# Patient Record
Sex: Female | Born: 1990 | Race: White | Hispanic: No | Marital: Single | Smoking: Never smoker
Health system: Southern US, Community
[De-identification: ages and names within clinical notes are randomized; demographics above are authoritative.]

## PROBLEM LIST (undated history)

## (undated) DIAGNOSIS — D649 Anemia, unspecified: Secondary | ICD-10-CM

## (undated) DIAGNOSIS — F32A Depression, unspecified: Secondary | ICD-10-CM

## (undated) DIAGNOSIS — F329 Major depressive disorder, single episode, unspecified: Secondary | ICD-10-CM

## (undated) HISTORY — DX: Anemia, unspecified: D64.9

## (undated) HISTORY — PX: NO PAST SURGERIES: SHX2092

## (undated) HISTORY — DX: Depression, unspecified: F32.A

## (undated) HISTORY — DX: Major depressive disorder, single episode, unspecified: F32.9

---
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
## 2012-08-01 ENCOUNTER — Encounter: Payer: Self-pay | Admitting: Obstetrics and Gynecology

## 2012-08-03 ENCOUNTER — Encounter: Payer: Self-pay | Admitting: Obstetrics and Gynecology

## 2012-08-03 ENCOUNTER — Ambulatory Visit: Payer: Self-pay | Admitting: Obstetrics and Gynecology

## 2012-08-03 DIAGNOSIS — Z01419 Encounter for gynecological examination (general) (routine) without abnormal findings: Secondary | ICD-10-CM

## 2012-08-15 ENCOUNTER — Ambulatory Visit: Payer: Self-pay | Admitting: Obstetrics and Gynecology

## 2012-08-15 DIAGNOSIS — Z01419 Encounter for gynecological examination (general) (routine) without abnormal findings: Secondary | ICD-10-CM

## 2012-08-16 ENCOUNTER — Encounter: Payer: Self-pay | Admitting: Obstetrics and Gynecology

## 2013-01-13 ENCOUNTER — Telehealth: Payer: Self-pay | Admitting: Nurse Practitioner

## 2013-01-13 NOTE — Telephone Encounter (Signed)
Patient Paid one of the 27 no show fees but is stating she didn't get a reminder for the other one i told her that the calls are just a courtesy. Still wanted to speak to someone about it.

## 2013-02-01 NOTE — Telephone Encounter (Signed)
01/23/13 I called the patient back to discuss charge. Called [phone].Not possible to leave a voice mail because her mailbox was full.

## 2013-09-08 ENCOUNTER — Encounter: Payer: Self-pay | Admitting: Gynecology

## 2013-09-08 ENCOUNTER — Ambulatory Visit (INDEPENDENT_AMBULATORY_CARE_PROVIDER_SITE_OTHER): Payer: 59 | Admitting: Gynecology

## 2013-09-08 VITALS — BP 100/80 | HR 70 | Resp 14 | Ht 68.0 in | Wt 182.0 lb

## 2013-09-08 DIAGNOSIS — N899 Noninflammatory disorder of vagina, unspecified: Secondary | ICD-10-CM

## 2013-09-08 DIAGNOSIS — Z01419 Encounter for gynecological examination (general) (routine) without abnormal findings: Secondary | ICD-10-CM

## 2013-09-08 DIAGNOSIS — Z0000 Encounter for general adult medical examination without abnormal findings: Secondary | ICD-10-CM

## 2013-09-08 DIAGNOSIS — Z3009 Encounter for other general counseling and advice on contraception: Secondary | ICD-10-CM

## 2013-09-08 DIAGNOSIS — N898 Other specified noninflammatory disorders of vagina: Secondary | ICD-10-CM

## 2013-09-08 DIAGNOSIS — Z113 Encounter for screening for infections with a predominantly sexual mode of transmission: Secondary | ICD-10-CM

## 2013-09-08 DIAGNOSIS — Z30011 Encounter for initial prescription of contraceptive pills: Secondary | ICD-10-CM

## 2013-09-08 DIAGNOSIS — Z124 Encounter for screening for malignant neoplasm of cervix: Secondary | ICD-10-CM

## 2013-09-08 LAB — POCT URINALYSIS DIPSTICK
PH UA: 5
UROBILINOGEN UA: NEGATIVE

## 2013-09-08 LAB — HEMOGLOBIN, FINGERSTICK: HEMOGLOBIN, FINGERSTICK: 13.8 g/dL (ref 12.0–16.0)

## 2013-09-08 MED ORDER — LEVONORGESTREL-ETHINYL ESTRAD 0.15-30 MG-MCG PO TABS: 1.0000 | ORAL_TABLET | Freq: Every day | ORAL | Status: DC

## 2013-09-08 NOTE — Patient Instructions (Signed)

## 2013-09-08 NOTE — Progress Notes (Signed)
23 y.o. Single Caucasian female   G0P0 here for annual exam. Pt is  currently sexually active.  She reports  using condoms on a regular basis.  First sexual activity at 23  years old, 3 number of lifetime partners.     Patient's last menstrual period was 08/31/2013.          Sexually active: Yes.    The current method of family planning is condoms all of the time.    Exercising: Yes.    run 3x/q 2 weeks Last pap: never had one Alcohol: 2 drinks/wk Tobacco:  no Drugs: no Gardisil: yes, completed: 01/12/12   Hgb:  13.8; Urine: Leuks 2, Trace Blood   Health Maintenance  Topic Date Due  . Pap Smear  11/06/2008  . Tetanus/tdap  11/06/2009  . Influenza Vaccine  08/26/2013    Family History  Problem Relation Age of Onset  . Cancer Maternal Grandfather     lung cancer  . Cancer Paternal Grandmother     skin cancer  . Diabetes Paternal Grandfather     There are no active problems to display for this patient.   No past medical history on file.  No past surgical history on file.  Allergies: Review of patient's allergies indicates not on file.  Current Outpatient Prescriptions  Medication Sig Dispense Refill  . Acetaminophen (TYLENOL PO) Take by mouth as needed.       No current facility-administered medications for this visit.    ROS: Pertinent items are noted in HPI.  Exam:    BP 100/80  Pulse 70  Resp 14  Ht 5\' 8"  (1.727 m)  Wt 182 lb (82.555 kg)  BMI 27.68 kg/m2  LMP 08/31/2013 Weight change: @WEIGHTCHANGE @ Last 3 height recordings:  Ht Readings from Last 3 Encounters:  09/08/13 5\' 8"  (1.727 m)   General appearance: alert, cooperative and appears stated age Head: Normocephalic, without obvious abnormality, atraumatic Neck: no adenopathy, no carotid bruit, no JVD, supple, symmetrical, trachea midline and thyroid not enlarged, symmetric, no tenderness/mass/nodules Lungs: clear to auscultation bilaterally Breasts: normal appearance, no masses or  tenderness Heart: regular rate and rhythm, S1, S2 normal, no murmur, click, rub or gallop Abdomen: soft, non-tender; bowel sounds normal; no masses,  no organomegaly Extremities: extremities normal, atraumatic, no cyanosis or edema Skin: Skin color, texture, turgor normal. No rashes or lesions Lymph nodes: Cervical, supraclavicular, and axillary nodes normal. no inguinal nodes palpated Neurologic: Grossly normal   Pelvic: External genitalia:  no lesions              Urethra: normal appearing urethra with no masses, tenderness or lesions              Bartholins and Skenes: Bartholin's, Urethra, Skene's normal                 Vagina: normal appearing vagina with normal color and discharge, no lesions              Cervix: normal appearance              Pap taken: Yes.          Bimanual Exam:  Uterus:  uterus is normal size, shape, consistency and nontender                                      Adnexa:    normal adnexa in size, nontender and no  masses                                      Rectovaginal: Deferred                                      Anus:  defer exam   P:   1. Routine gynecological examination counseled on breast self exam, STD prevention, use and side effects of OCP's, STD  prevention return annually or prn Discussed STD prevention, regular condom use.  2. Laboratory examination ordered as part of a routine general medical examination  - POCT Urinalysis Dipstick - Hemoglobin, fingerstick  3. Vaginal irritation Discharge not noted on exam will f/u with PAP findings-pt agreeable  4. Screening for cervical cancer Guidelines reviewed - PAP with Reflex to HPV (IPS)  5. Encounter for initial prescription of contraceptive pills Reviewed risks and benefits of ocp-no FHx of DVT/PE.  Reviewed IUD and nexpnaon as well Instructions re taking reviewed Condoms recommended - levonorgestrel-ethinyl estradiol (NORDETTE) 0.15-30 MG-MCG tablet; Take 1 tablet by mouth daily.   Dispense: 1 Package; Refill: 12  6. Screen for STD (sexually transmitted disease)  - N. gonorrhoea and Chlamydia by PCR (IPS)    An After Visit Summary was printed and given to the patient.

## 2013-09-12 LAB — IPS N GONORRHOEA AND CHLAMYDIA BY PCR

## 2013-09-12 LAB — IPS PAP TEST WITH REFLEX TO HPV

## 2013-09-13 ENCOUNTER — Telehealth: Payer: Self-pay | Admitting: *Deleted

## 2013-09-13 NOTE — Telephone Encounter (Signed)
Message copied by Lorraine LaxSHAW, Hamsa Laurich J on Wed Sep 13, 2013 10:08 AM ------      Message from: Douglass RiversLATHROP, TRACY      Created: Tue Sep 12, 2013  4:54 PM       Nl pap, inform no discharge noted on pap, recall 2 ------

## 2013-09-13 NOTE — Telephone Encounter (Signed)
Tried to call patient and LM but there is no voicemail box set up yet.

## 2013-09-18 NOTE — Telephone Encounter (Signed)
Tried to LM no vm set up

## 2013-09-25 NOTE — Telephone Encounter (Signed)
Have been trying to contact patient since the 19th have not gotten a call back patient has no voice mail box set up.  Released results through MyChart.

## 2013-11-22 ENCOUNTER — Telehealth: Payer: Self-pay | Admitting: Gynecology

## 2013-11-22 NOTE — Telephone Encounter (Signed)
Left message to call back to reschedule 3 month recheck appointment that was with Dr. Farrel GobbleLathrop for 12/08/13.   She also needs to reschedule her AEX for next August 2016.

## 2013-11-22 NOTE — Telephone Encounter (Signed)
RESCHEDULED BOTH APPOINTMENTS WITH DL I WILL CLOSE THE ENCOUNTER.

## 2013-11-23 NOTE — Telephone Encounter (Signed)
Patient is rescheduled for appointment for both annual and 3 month recheck with Verner Choleborah S. Leonard CNM.   Same day for both, different times.  Patient needs to be notified of time and provider change on return call.

## 2013-11-24 ENCOUNTER — Other Ambulatory Visit: Payer: Self-pay

## 2013-11-24 DIAGNOSIS — Z30011 Encounter for initial prescription of contraceptive pills: Secondary | ICD-10-CM

## 2013-11-24 MED ORDER — LEVONORGESTREL-ETHINYL ESTRAD 0.15-30 MG-MCG PO TABS: 1.0000 | ORAL_TABLET | Freq: Every day | ORAL | Status: DC

## 2013-11-24 NOTE — Telephone Encounter (Signed)
Incoming Refill Request from [facility] WU:[id number]:Nordette  Last AEX:09/08/13 Last Refill:09/08/13 #1 X 12  Pt INS request 90 day supply to a home delivery service. Ok to send?   (Dr. Farrel GobbleLathrop pt)

## 2013-11-24 NOTE — Telephone Encounter (Signed)
Yes.  Ok to RF.  RF signed to mail order.

## 2013-12-08 ENCOUNTER — Ambulatory Visit: Payer: 59 | Admitting: Certified Nurse Midwife

## 2013-12-08 ENCOUNTER — Ambulatory Visit: Payer: 59 | Admitting: Gynecology

## 2013-12-08 ENCOUNTER — Telehealth: Payer: Self-pay | Admitting: Certified Nurse Midwife

## 2013-12-08 ENCOUNTER — Encounter: Payer: Self-pay | Admitting: Certified Nurse Midwife

## 2013-12-08 NOTE — Telephone Encounter (Signed)
Pt rescheduled dnka appointment today to Monday 11/16 for 3 month recheck.

## 2013-12-11 ENCOUNTER — Ambulatory Visit (INDEPENDENT_AMBULATORY_CARE_PROVIDER_SITE_OTHER): Payer: 59 | Admitting: Certified Nurse Midwife

## 2013-12-11 ENCOUNTER — Encounter: Payer: Self-pay | Admitting: Certified Nurse Midwife

## 2013-12-11 VITALS — BP 100/70 | HR 70 | Resp 16 | Ht 68.0 in | Wt 186.0 lb

## 2013-12-11 DIAGNOSIS — Z3041 Encounter for surveillance of contraceptive pills: Secondary | ICD-10-CM

## 2013-12-11 DIAGNOSIS — N920 Excessive and frequent menstruation with regular cycle: Secondary | ICD-10-CM

## 2013-12-11 DIAGNOSIS — N946 Dysmenorrhea, unspecified: Secondary | ICD-10-CM

## 2013-12-11 MED ORDER — LEVONORGESTREL-ETHINYL ESTRAD 0.15-30 MG-MCG PO TABS: 1.0000 | ORAL_TABLET | Freq: Every day | ORAL | Status: DC

## 2013-12-11 NOTE — Progress Notes (Signed)
23 y.o. Single Caucasian G0P0here for evaluation of Nordette initiated on 10/01/13 for Contraception Menses duration 5 days with lighter flow. Patient taking medication as prescribed. Denies missed pills, headaches, nausea, DVT warning signs or symptoms,  breakthrough bleeding, or other changes.   Keeping menses calendar. No other health issues today.  O: Healthy female, WD WN Affect: normal orientation X 3    A: History of menorrhagia and dysmenorrhea with Nordette working well for cycle control  P: Continue  as prescribed. Call if any problems. Continue menstrual calendar. Rx updated for one year   18 minutes spent with patient in face to face counseling regarding OCP use.  RV Aex, prn

## 2013-12-11 NOTE — Patient Instructions (Signed)

## 2013-12-12 NOTE — Progress Notes (Signed)
Reviewed personally.  M. Suzanne Amiley Shishido, MD.  

## 2014-09-11 ENCOUNTER — Ambulatory Visit: Payer: 59 | Admitting: Certified Nurse Midwife

## 2014-09-11 ENCOUNTER — Telehealth: Payer: Self-pay | Admitting: Certified Nurse Midwife

## 2014-09-11 ENCOUNTER — Ambulatory Visit: Payer: 59 | Admitting: Gynecology

## 2014-09-11 NOTE — Telephone Encounter (Signed)
Patient rescheduled aex appointment for today at 12:45. She can't get off work because someone called out at her job.

## 2014-10-25 ENCOUNTER — Encounter: Payer: Self-pay | Admitting: Certified Nurse Midwife

## 2014-10-25 ENCOUNTER — Ambulatory Visit (INDEPENDENT_AMBULATORY_CARE_PROVIDER_SITE_OTHER): Payer: 59 | Admitting: Certified Nurse Midwife

## 2014-10-25 VITALS — BP 100/64 | HR 68 | Resp 16 | Ht 68.25 in | Wt 194.0 lb

## 2014-10-25 DIAGNOSIS — Z3041 Encounter for surveillance of contraceptive pills: Secondary | ICD-10-CM

## 2014-10-25 DIAGNOSIS — N898 Other specified noninflammatory disorders of vagina: Secondary | ICD-10-CM | POA: Diagnosis not present

## 2014-10-25 DIAGNOSIS — Z01419 Encounter for gynecological examination (general) (routine) without abnormal findings: Secondary | ICD-10-CM | POA: Diagnosis not present

## 2014-10-25 DIAGNOSIS — Z0000 Encounter for general adult medical examination without abnormal findings: Secondary | ICD-10-CM

## 2014-10-25 LAB — HEMOGLOBIN, FINGERSTICK: Hemoglobin, fingerstick: 14 g/dL (ref 12.0–16.0)

## 2014-10-25 LAB — TSH: TSH: 2.384 u[IU]/mL (ref 0.350–4.500)

## 2014-10-25 MED ORDER — LEVONORGESTREL-ETHINYL ESTRAD 0.15-30 MG-MCG PO TABS: 1.0000 | ORAL_TABLET | Freq: Every day | ORAL | Status: DC

## 2014-10-25 NOTE — Progress Notes (Signed)
Encounter reviewed Shantice Menger, MD   

## 2014-10-25 NOTE — Progress Notes (Signed)
24 y.o. G0P0 Single Caucasian Fe here for annual exam. Periods normal, no issues. Contraception working well. Has been seeing counselor for depression and job related stress. She has terminated her employment and the crying and feelings of depression are so much better. 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 Plans to take some time before seeking other employment. Broke up with partner due ? Trust issues. Desires STD screening. Still has some fatigue feelings but has not started working out again. Has noticed hip pain again she had playing supports. Sees urgent care if needed. No other health issues today.  Patient's last menstrual period was 09/03/2014.          Sexually active: No.  The current method of family planning is OCP (estrogen/progesterone).    Exercising: Yes.    running & weights Smoker:  no  Health Maintenance: Pap: 09-08-13 neg MMG:  none Colonoscopy:  none BMD:   none TDaP:  2013 Labs: hgb-14.0 Self breast exam: done occ   reports that she has never smoked. She has never used smokeless tobacco. She reports that she drinks about 0.6 - 1.2 oz of alcohol per week. She reports that she does not use illicit drugs.  Past Medical History  Diagnosis Date  . Anemia     History reviewed. No pertinent past surgical history.  Current Outpatient Prescriptions  Medication Sig Dispense Refill  . Acetaminophen (TYLENOL PO) Take by mouth as needed.    Marland Kitchen levonorgestrel-ethinyl estradiol (NORDETTE) 0.15-30 MG-MCG tablet Take 1 tablet by mouth daily. 3 Package 4   No current facility-administered medications for this visit.    Family History  Problem Relation Age of Onset  . Cancer Maternal Grandfather     lung cancer  . Cancer Paternal Grandmother     skin cancer  . Diabetes Paternal Grandfather     ROS:  Pertinent items are noted in HPI.  Otherwise, a comprehensive ROS was negative.  Exam:   BP 100/64 mmHg  Pulse  68  Resp 16  Ht 5' 8.25" (1.734 m)  Wt 194 lb (87.998 kg)  BMI 29.27 kg/m2  LMP 09/03/2014 Height: 5' 8.25" (173.4 cm) Ht Readings from Last 3 Encounters:  10/25/14 5' 8.25" (1.734 m)  12/11/13  (1.727 m)  09/08/13  (1.727 m)    General appearance: alert, cooperative and appears stated age Head: Normocephalic, without obvious abnormality, atraumatic Neck: no adenopathy, supple, symmetrical, trachea midline and thyroid normal to inspection and palpation Lungs: clear to auscultation bilaterally Breasts: normal appearance, no masses or tenderness, No nipple retraction or dimpling, No nipple discharge or bleeding, No axillary or supraclavicular adenopathy Heart: regular rate and rhythm Abdomen: soft, non-tender; no masses,  no organomegaly Extremities: extremities normal, atraumatic, no cyanosis or edema. Hip flexion right and standing normal. Skin: Skin color, texture, turgor normal. No rashes or lesions Lymph nodes: Cervical, supraclavicular, and axillary nodes normal. No abnormal inguinal nodes palpated Neurologic: Grossly normal   Pelvic: External genitalia:  no lesions              Urethra:  normal appearing urethra with no masses, tenderness or lesions              Bartholin's and Skene's: normal                 Vagina: normal appearing vagina with normal color and discharge, no lesions, affirm taken  Cervix: normal,non tender,no lesions              Pap taken: No. Bimanual Exam:  Uterus:  normal size, contour, position, consistency, mobility, non-tender              Adnexa: normal adnexa and no mass, fullness, tenderness               Rectovaginal: Confirms               Anus:  normal appearance  Chaperone present: yes  A:  Well Woman with normal exam  Contraception OCP desired  STD screening  Depression employment related in counseling  Right hip pain ? Old injury with sports    P:   Reviewed health and wellness pertinent to exam  Rx Nordette  continuous use see order  Labs: GC,Chlamydia, HIV,RPR,Hep. B, TSH, Vitamin D Affirm  Continue follow up with counselor as indicated  Encouraged to try OTC Alieve to see if resolves in 3 days, if no change seek Orthopedic evaluation  Pap smear as above not taken today   counseled on breast self exam, STD prevention, HIV risk factors and prevention, use and side effects of OCP's, adequate intake of calcium and vitamin D, diet and exercise  return annually or prn  An After Visit Summary was printed and given to the patient.

## 2014-10-25 NOTE — Patient Instructions (Signed)
General topics  Next pap or exam is  due in 1 year Take a Women's multivitamin Take 1200 mg. of calcium daily - prefer dietary If any concerns in interim to call back  Breast Self-Awareness Practicing breast self-awareness may pick up problems early, prevent significant medical complications, and possibly save your life. By practicing breast self-awareness, you can become familiar with how your breasts look and feel and if your breasts are changing. This allows you to notice changes early. It can also offer you some reassurance that your breast health is good. One way to learn what is normal for your breasts and whether your breasts are changing is to do a breast self-exam. If you find a lump or something that was not present in the past, it is best to contact your caregiver right away. Other findings that should be evaluated by your caregiver include nipple discharge, especially if it is bloody; skin changes or reddening; areas where the skin seems to be pulled in (retracted); or new lumps and bumps. Breast pain is seldom associated with cancer (malignancy), but should also be evaluated by a caregiver. BREAST SELF-EXAM The best time to examine your breasts is 5 7 days after your menstrual period is over.  ExitCare Patient Information 2013 [employer].   Exercise to Stay Healthy Exercise helps you become and stay healthy. EXERCISE IDEAS AND TIPS Choose exercises that:  You enjoy.  Fit into your day. You do not need to exercise really hard to be healthy. You can do exercises at a slow or medium level and stay healthy. You can:  Stretch before and after working out.  Try yoga, Pilates, or tai chi.  Lift weights.  Walk fast, swim, jog, run, climb stairs, bicycle, dance, or rollerskate.  Take aerobic classes. Exercises that burn about 150 calories:  Running 1  miles in 15 minutes.  Playing volleyball for 45 to 60 minutes.  Washing and waxing a car for 45 to 60  minutes.  Playing touch football for 45 minutes.  Walking 1  miles in 35 minutes.  Pushing a stroller 1  miles in 30 minutes.  Playing basketball for 30 minutes.  Raking leaves for 30 minutes.  Bicycling 5 miles in 30 minutes.  Walking 2 miles in 30 minutes.  Dancing for 30 minutes.  Shoveling snow for 15 minutes.  Swimming laps for 20 minutes.  Walking up stairs for 15 minutes.  Bicycling 4 miles in 15 minutes.  Gardening for 30 to 45 minutes.  Jumping rope for 15 minutes.  Washing windows or floors for 45 to 60 minutes. Document Released: 02/14/2010 Document Revised: 04/06/2011 Document Reviewed: 02/14/2010 ExitCare Patient Information 2013 ExitCare[employer].   Other topics ( that may be useful information):    Sexually Transmitted Disease Sexually transmitted disease (STD) refers to any infection that is passed from person to person during sexual activity. This may happen by way of saliva, semen, blood, vaginal mucus, or urine. Common STDs include:  Gonorrhea.  Chlamydia.  Syphilis.  HIV/AIDS.  Genital herpes.  Hepatitis B and C.  Trichomonas.  Human papillomavirus (HPV).  Pubic lice. CAUSES  An STD may be spread by bacteria, virus, or parasite. A person can get an STD by:  Sexual intercourse with an infected person.  Sharing sex toys with an infected person.  Sharing needles with an infected person.  Having intimate contact with the genitals, mouth, or rectal areas of an infected person. SYMPTOMS  Some people may not have any symptoms, but   they can still pass the infection to others. Different STDs have different symptoms. Symptoms include:  Painful or bloody urination.  Pain in the pelvis, abdomen, vagina, anus, throat, or eyes.  Skin rash, itching, irritation, growths, or sores (lesions). These usually occur in the genital or anal area.  Abnormal vaginal discharge.  Penile discharge in men.  Soft, flesh-colored skin growths in the  genital or anal area.  Fever.  Pain or bleeding during sexual intercourse.  Swollen glands in the groin area.  Yellow skin and eyes (jaundice). This is seen with hepatitis. DIAGNOSIS  To make a diagnosis, your caregiver may:  Take a medical history.  Perform a physical exam.  Take a specimen (culture) to be examined.  Examine a sample of discharge under a microscope.  Perform blood test TREATMENT   Chlamydia, gonorrhea, trichomonas, and syphilis can be cured with antibiotic medicine.  Genital herpes, hepatitis, and HIV can be treated, but not cured, with prescribed medicines. The medicines will lessen the symptoms.  Genital warts from HPV can be treated with medicine or by freezing, burning (electrocautery), or surgery. Warts may come back.  HPV is a virus and cannot be cured with medicine or surgery.However, abnormal areas may be followed very closely by your caregiver and may be removed from the cervix, vagina, or vulva through office procedures or surgery. If your diagnosis is confirmed, your recent sexual partners need treatment. This is true even if they are symptom-free or have a negative culture or evaluation. They should not have sex until their caregiver says it is okay. HOME CARE INSTRUCTIONS  All sexual partners should be informed, tested, and treated for all STDs.  Take your antibiotics as directed. Finish them even if you start to feel better.  Only take over-the-counter or prescription medicines for pain, discomfort, or fever as directed by your caregiver.  Rest.  Eat a balanced diet and drink enough fluids to keep your urine clear or pale yellow.  Do not have sex until treatment is completed and you have followed up with your caregiver. STDs should be checked after treatment.  Keep all follow-up appointments, Pap tests, and blood tests as directed by your caregiver.  Only use latex condoms and water-soluble lubricants during sexual activity. Do not use  petroleum jelly or oils.  Avoid alcohol and illegal drugs.  Get vaccinated for HPV and hepatitis. If you have not received these vaccines in the past, talk to your caregiver about whether one or both might be right for you.  Avoid risky sex practices that can break the skin. The only way to avoid getting an STD is to avoid all sexual activity.Latex condoms and dental dams (for oral sex) will help lessen the risk of getting an STD, but will not completely eliminate the risk. SEEK MEDICAL CARE IF:   You have a fever.  You have any new or worsening symptoms. Document Released: 04/04/2002 Document Revised: 04/06/2011 Document Reviewed: 04/11/2010 [facility] Patient Information 2013 [employer].    Domestic Abuse You are being battered or abused if someone close to you hits, pushes, or physically hurts you in any way. You also are being abused if you are forced into activities. You are being sexually abused if you are forced to have sexual contact of any kind. You are being emotionally abused if you are made to feel worthless or if you are constantly threatened. It is important to remember that help is available. No one has the right to abuse you. PREVENTION OF FURTHER  ABUSE  Learn the warning signs of danger. This varies with situations but may include: the use of alcohol, threats, isolation from friends and family, or forced sexual contact. Leave if you feel that violence is going to occur.  If you are attacked or beaten, report it to the police so the abuse is documented. You do not have to press charges. The police can protect you while you or the attackers are leaving. Get the officer's name and badge number and a copy of the report.  Find someone you can trust and tell them what is happening to you: your caregiver, a nurse, clergy member, close friend or family member. Feeling ashamed is natural, but remember that you have done nothing wrong. No one deserves abuse. Document Released:  01/10/2000 Document Revised: 04/06/2011 Document Reviewed: 03/20/2010 ExitCare Patient Information 2013 [employer]Care[employer].    How Much is Too Much Alcohol? Drinking too much alcohol can cause injury, accidents, and health problems. These types of problems can include:   Car crashes.  Falls.  Family fighting (domestic violence).  Drowning.  Fights.  Injuries.  Burns.  Damage to certain organs.  Having a baby with birth defects. ONE DRINK CAN BE TOO MUCH WHEN YOU ARE:  Working.  Pregnant or breastfeeding.  Taking medicines. Ask your doctor.  Driving or planning to drive. If you or someone you know has a drinking problem, get help from a doctor.  Document Released: 11/08/2008 Document Revised: 04/06/2011 Document Reviewed: 11/08/2008 ExitCare Patient Information 2013 [employer]Care[employer].   Smoking Hazards Smoking cigarettes is extremely bad for your health. Tobacco smoke has over 200 known poisons in it. There are over 60 chemicals in tobacco smoke that cause cancer. Some of the chemicals found in cigarette smoke include:   Cyanide.  Benzene.  Formaldehyde.  Methanol (wood alcohol).  Acetylene (fuel used in welding torches).  Ammonia. Cigarette smoke also contains the poisonous gases nitrogen oxide and carbon monoxide.  Cigarette smokers have an increased risk of many serious medical problems and Smoking causes approximately:  90% of all lung cancer deaths in men.  80% of all lung cancer deaths in women.  90% of deaths from chronic obstructive lung disease. Compared with nonsmokers, smoking increases the risk of:  Coronary heart disease by 2 to 4 times.  Stroke by 2 to 4 times.  Men developing lung cancer by 23 times.  Women developing lung cancer by 13 times.  Dying from chronic obstructive lung diseases by 12 times.  . Smoking is the most preventable cause of death and disease in our society.  WHY IS SMOKING ADDICTIVE?  Nicotine is the chemical  agent in tobacco that is capable of causing addiction or dependence.  When you smoke and inhale, nicotine is absorbed rapidly into the bloodstream through your lungs. Nicotine absorbed through the lungs is capable of creating a powerful addiction. Both inhaled and non-inhaled nicotine may be addictive.  Addiction studies of cigarettes and spit tobacco show that addiction to nicotine occurs mainly during the teen years, when young people begin using tobacco products. WHAT ARE THE BENEFITS OF QUITTING?  There are many health benefits to quitting smoking.   Likelihood of developing cancer and heart disease decreases. Health improvements are seen almost immediately.  Blood pressure, pulse rate, and breathing patterns start returning to normal soon after quitting. QUITTING SMOKING   American Lung Association - 1-800-LUNGUSA  American Cancer Society - 1-800-ACS-2345 Document Released: 02/20/2004 Document Revised: 04/06/2011 Document Reviewed: 10/24/2008 ExitCare Patient Information 2013 ExitCare,   LLC.   Stress Management Stress is a state of physical or mental tension that often results from changes in your life or normal routine. Some common causes of stress are:  Death of a loved one.  Injuries or severe illnesses.  Getting fired or changing jobs.  Moving into a new home. Other causes may be:  Sexual problems.  Business or financial losses.  Taking on a large debt.  Regular conflict with someone at home or at work.  Constant tiredness from lack of sleep. It is not just bad things that are stressful. It may be stressful to:  Win the lottery.  Get married.  Buy a new car. The amount of stress that can be easily tolerated varies from person to person. Changes generally cause stress, regardless of the types of change. Too much stress can affect your health. It may lead to physical or emotional problems. Too little stress (boredom) may also become stressful. SUGGESTIONS TO  REDUCE STRESS:  Talk things over with your family and friends. It often is helpful to share your concerns and worries. If you feel your problem is serious, you may want to get help from a professional counselor.  Consider your problems one at a time instead of lumping them all together. Trying to take care of everything at once may seem impossible. List all the things you need to do and then start with the most important one. Set a goal to accomplish 2 or 3 things each day. If you expect to do too many in a single day you will naturally fail, causing you to feel even more stressed.  Do not use alcohol or drugs to relieve stress. Although you may feel better for a short time, they do not remove the problems that caused the stress. They can also be habit forming.  Exercise regularly - at least 3 times per week. Physical exercise can help to relieve that "uptight" feeling and will relax you.  The shortest distance between despair and hope is often a good night's sleep.  Go to bed and get up on time allowing yourself time for appointments without being rushed.  Take a short "time-out" period from any stressful situation that occurs during the day. Close your eyes and take some deep breaths. Starting with the muscles in your face, tense them, hold it for a few seconds, then relax. Repeat this with the muscles in your neck, shoulders, hand, stomach, back and legs.  Take good care of yourself. Eat a balanced diet and get plenty of rest.  Schedule time for having fun. Take a break from your daily routine to relax. HOME CARE INSTRUCTIONS   Call if you feel overwhelmed by your problems and feel you can no longer manage them on your own.  Return immediately if you feel like hurting yourself or someone else. Document Released: 07/08/2000 Document Revised: 04/06/2011 Document Reviewed: 02/28/2007 ExitCare Patient Information 2013 ExitCare, LLC.   

## 2014-10-26 LAB — STD PANEL
HIV 1&2 Ab, 4th Generation: NONREACTIVE
Hepatitis B Surface Ag: NEGATIVE

## 2014-10-26 LAB — WET PREP BY MOLECULAR PROBE
Candida species: NEGATIVE
Gardnerella vaginalis: NEGATIVE
Trichomonas vaginosis: NEGATIVE

## 2014-10-26 LAB — VITAMIN D 25 HYDROXY (VIT D DEFICIENCY, FRACTURES): Vit D, 25-Hydroxy: 34 ng/mL (ref 30–100)

## 2014-10-27 LAB — IPS N GONORRHOEA AND CHLAMYDIA BY PCR

## 2015-10-29 ENCOUNTER — Ambulatory Visit: Payer: 59 | Admitting: Certified Nurse Midwife

## 2015-10-30 ENCOUNTER — Ambulatory Visit (INDEPENDENT_AMBULATORY_CARE_PROVIDER_SITE_OTHER): Payer: BLUE CROSS/BLUE SHIELD | Admitting: Certified Nurse Midwife

## 2015-10-30 ENCOUNTER — Encounter: Payer: Self-pay | Admitting: Certified Nurse Midwife

## 2015-10-30 VITALS — BP 110/64 | HR 68 | Resp 16 | Ht 68.5 in | Wt 212.0 lb

## 2015-10-30 DIAGNOSIS — Z0000 Encounter for general adult medical examination without abnormal findings: Secondary | ICD-10-CM | POA: Diagnosis not present

## 2015-10-30 DIAGNOSIS — Z01419 Encounter for gynecological examination (general) (routine) without abnormal findings: Secondary | ICD-10-CM

## 2015-10-30 NOTE — Patient Instructions (Signed)

## 2015-10-30 NOTE — Progress Notes (Signed)
25 y.o. G0P0 Single  Caucasian Fe here for annual exam. Periods normal, no issues.Returned from [place] and had some diarrhea on returning to the [place]. Resolving now. Back on weight watchers to loose her weight again. She worked in [place] during the summer. Felt like it was a good experience. Having hearing loss in right ear, had problems with was before, please check. No other health issues today.  Patient's last menstrual period was 10/30/2015.          Sexually active: never The current method of family planning is abstinence.    Exercising: Yes.    walking, jogging, running, pilates Smoker:  no  Health Maintenance: Pap: 09-08-13 neg MMG:  none Colonoscopy:  none BMD:   none TDaP:  2013 Shingles: no Pneumonia: no Hep C and HIV: HIV neg 2016 Labs: hgb-14.1 Self breast exam: done occ   reports that she has never smoked. She has never used smokeless tobacco. She reports that she does not drink alcohol or use drugs.  Past Medical History:  Diagnosis Date  . Anemia     History reviewed. No pertinent surgical history.  No current outpatient prescriptions on file.   No current facility-administered medications for this visit.     Family History  Problem Relation Age of Onset  . Cancer Maternal Grandfather     lung cancer  . Cancer Paternal Grandmother     skin cancer  . Diabetes Paternal Grandfather     ROS:  Pertinent items are noted in HPI.  Otherwise, a comprehensive ROS was negative.  Exam:   BP 110/64   Pulse 68   Resp 16   Ht 5' 8.5" (1.74 m)   Wt 212 lb (96.2 kg)   LMP 10/30/2015   BMI 31.77 kg/m  Height: 5' 8.5" (174 cm) Ht Readings from Last 3 Encounters:  10/30/15 5' 8.5" (1.74 m)  10/25/14 5' 8.25" (1.734 m)  12/11/13 5\' 8"  (1.727 m)    General appearance: alert, cooperative and appears stated age Head: Normocephalic, without obvious abnormality, atraumatic Ears: left normal appearance, right ear, membrane not visualized due to was occulusion, non  tender. Neck: no adenopathy, supple, symmetrical, trachea midline and thyroid normal to inspection and palpation Lungs: clear to auscultation bilaterally Breasts: normal appearance, no masses or tenderness, No nipple retraction or dimpling, No nipple discharge or bleeding, No axillary or supraclavicular adenopathy Heart: regular rate and rhythm Abdomen: soft, non-tender; no masses,  no organomegaly Extremities: extremities normal, atraumatic, no cyanosis or edema Skin: Skin color, texture, turgor normal. No rashes or lesions Lymph nodes: Cervical, supraclavicular, and axillary nodes normal. No abnormal inguinal nodes palpated Neurologic: Grossly normal   Pelvic: External genitalia:  no lesions              Urethra:  normal appearing urethra with no masses, tenderness or lesions              Bartholin's and Skene's: normal                 Vagina: normal appearing vagina with normal color and discharge, no lesions              Cervix: no cervical motion tenderness, no lesions and nulliparous appearance              Pap taken: No. Bimanual Exam:  Uterus:  normal size, contour, position, consistency, mobility, non-tender and anteverted              Adnexa: normal adnexa  and no mass, fullness, tenderness               Rectovaginal: Confirms               Anus:  normal appearance  Chaperone present: yes  A:  Well Woman with normal exam  Contraception  None needed, not sexually active  Foreign travel with some stool change resolving  Right ear wax occulsion noted  P:   Reviewed health and wellness pertinent to exam  Will advise if change  Discussed Floragen probiotic use to re-establish normal flora after travel, given information to consider with instructions  Discussed OTC Debrox for ear wax removal and softening, follow instructions, if no change will need to see PCP or ENT, should improve hearing. Questions addressed.  Pap smear as above not taken   counseled on breast self exam,  STD prevention, HIV risk factors and prevention, adequate intake of calcium and vitamin D, diet and exercise  return annually or prn  An After Visit Summary was printed and given to the patient.

## 2015-11-01 LAB — HEMOGLOBIN, FINGERSTICK: Hemoglobin, fingerstick: 14.1 g/dL (ref 12.0–16.0)

## 2015-11-03 NOTE — Progress Notes (Signed)
Encounter reviewed Tabitha Rybacki, MD   

## 2016-02-28 DIAGNOSIS — R6889 Other general symptoms and signs: Secondary | ICD-10-CM | POA: Diagnosis not present

## 2016-02-28 DIAGNOSIS — J22 Unspecified acute lower respiratory infection: Secondary | ICD-10-CM | POA: Diagnosis not present

## 2016-02-28 DIAGNOSIS — J019 Acute sinusitis, unspecified: Secondary | ICD-10-CM | POA: Diagnosis not present

## 2016-02-28 DIAGNOSIS — B9689 Other specified bacterial agents as the cause of diseases classified elsewhere: Secondary | ICD-10-CM | POA: Diagnosis not present

## 2016-03-06 DIAGNOSIS — J019 Acute sinusitis, unspecified: Secondary | ICD-10-CM | POA: Diagnosis not present

## 2016-03-06 DIAGNOSIS — J22 Unspecified acute lower respiratory infection: Secondary | ICD-10-CM | POA: Diagnosis not present

## 2016-03-06 DIAGNOSIS — B9689 Other specified bacterial agents as the cause of diseases classified elsewhere: Secondary | ICD-10-CM | POA: Diagnosis not present

## 2016-03-06 DIAGNOSIS — B373 Candidiasis of vulva and vagina: Secondary | ICD-10-CM | POA: Diagnosis not present

## 2016-06-12 DIAGNOSIS — H6642 Suppurative otitis media, unspecified, left ear: Secondary | ICD-10-CM | POA: Diagnosis not present

## 2016-07-08 ENCOUNTER — Ambulatory Visit (INDEPENDENT_AMBULATORY_CARE_PROVIDER_SITE_OTHER): Payer: BLUE CROSS/BLUE SHIELD | Admitting: Physician Assistant

## 2016-07-08 ENCOUNTER — Encounter: Payer: Self-pay | Admitting: Physician Assistant

## 2016-07-08 DIAGNOSIS — F329 Major depressive disorder, single episode, unspecified: Secondary | ICD-10-CM | POA: Insufficient documentation

## 2016-07-08 DIAGNOSIS — F331 Major depressive disorder, recurrent, moderate: Secondary | ICD-10-CM | POA: Diagnosis not present

## 2016-07-08 DIAGNOSIS — F32A Depression, unspecified: Secondary | ICD-10-CM | POA: Insufficient documentation

## 2016-07-08 NOTE — Progress Notes (Signed)
Pre visit review using our clinic review tool, if applicable. No additional management support is needed unless otherwise documented below in the visit note. 

## 2016-07-08 NOTE — Progress Notes (Signed)
Patient presents to clinic today to establish care.  Chronic Issues: Depression -- diagnosed in Fall 2016. Sees counselor every other week. Notes doing very well since that time until the past couple of months. Feels depressed mood stems from a combination of work and family dynamics.  Endorses occasional anhedonia. Denies HI.  Notes depressed mood about 4-5 days per week recently. Denies current suicidal ideation at present but has previously has spurts where she feels that everyone would be better off without her. Denies any suicidal plan or intention.   Health Maintenance: Immunizations -- tetanus up-to-date. PAP -- 09/08/2013. Normal per patient. Followed by GYN.  Past Medical History:  Diagnosis Date  . Anemia    Iron Deficiency -- Diagnosed when she was a teenager.  . Depression    Diagnosed Fall 2016.    Past Surgical History:  Procedure Laterality Date  . NO PAST SURGERIES      No current outpatient prescriptions on file prior to visit.   No current facility-administered medications on file prior to visit.     No Known Allergies  Family History  Problem Relation Age of Onset  . Cancer Maternal Grandfather        lung cancer  . Cancer Paternal Grandmother        skin cancer  . Diabetes Paternal Grandfather   . Hepatitis C Mother        On treatment  . Cancer Father        Skin  . Cancer Maternal Uncle     Social History   Social History  . Marital status: Single    Spouse name: N/A  . Number of children: 0  . Years of education: N/A   Occupational History  .      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 Smoking status: Never Smoker  . Smokeless tobacco: Never Used  . Alcohol use Yes     Comment: glass a wine about 3 times per month  . Drug use: No  . Sexual activity: No   Other Topics Concern  . Not on file   Social History Narrative  . No narrative on file   Review of Systems  Constitutional: Negative for  fever and weight loss.  Respiratory: Negative for cough and shortness of breath.   Cardiovascular: Negative for chest pain and palpitations.  Neurological: Negative for dizziness and loss of consciousness.  Psychiatric/Behavioral: Positive for depression. Negative for hallucinations, substance abuse and suicidal ideas. The patient is not nervous/anxious and does not have insomnia.    BP 110/78   Pulse 74   Temp 97.9 F (36.6 C) (Oral)   Resp 14   Ht 5\' 8"  (1.727 m)   Wt 222 lb (100.7 kg)   SpO2 98%   BMI 33.75 kg/m   Physical Exam  Constitutional: She is oriented to person, place, and time and well-developed, well-nourished, and in no distress.  HENT:  Head: Normocephalic and atraumatic.  Eyes: Conjunctivae are normal.  Neck: Neck supple.  Cardiovascular: Normal rate, regular rhythm, normal heart sounds and intact distal pulses.   Pulmonary/Chest: Effort normal and breath sounds normal. No respiratory distress. She has no wheezes. She has no rales. She exhibits no tenderness.  Neurological: She is alert and oriented to person, place, and time.  Skin: Skin is warm and dry. No rash noted.  Psychiatric: Affect normal.  Vitals reviewed.  Assessment/Plan: Depression Discussed medications in addition to therapy every other week.  She declines starting today but wanted a list of medications to research. List provided. She is to call if she decides to start medication. Emergency numbers reviewed with patient.     Piedad ClimesMartin, Anica Alcaraz Cody, PA-C

## 2016-07-08 NOTE — Patient Instructions (Addendum)
Please continue counseling appointments as scheduled.  Please look into one of the medications below -- Citalopram (Celexa), Fluoxetine (Prozac) or Sertaline (Zoloft). I would recommend we start one of these giving current symptoms. Please call me and let me know.   Start a daily multivitamin and probiotic.  Consider starting a daily zyrtec during allergy season.

## 2016-07-08 NOTE — Assessment & Plan Note (Signed)
Discussed medications in addition to therapy every other week. She declines starting today but wanted a list of medications to research. List provided. She is to call if she decides to start medication. Emergency numbers reviewed with patient.

## 2016-07-10 ENCOUNTER — Telehealth: Payer: Self-pay | Admitting: Physician Assistant

## 2016-07-10 DIAGNOSIS — F331 Major depressive disorder, recurrent, moderate: Secondary | ICD-10-CM

## 2016-07-10 MED ORDER — CITALOPRAM HYDROBROMIDE 10 MG PO TABS: 10.0000 mg | ORAL_TABLET | Freq: Every day | ORAL | 1 refills | Status: DC

## 2016-07-10 NOTE — Telephone Encounter (Signed)
Pt asking for Rx celexa, [facility] [place]

## 2016-07-10 NOTE — Telephone Encounter (Signed)
Patient would like to start Celexa. Please advise of dosage and will send to the [facility]

## 2016-07-10 NOTE — Telephone Encounter (Signed)
LMOVM advising patient rx sent to the pharmacy

## 2016-07-10 NOTE — Telephone Encounter (Signed)
Start Citalopram 10 mg daily. Ok to send in quantity 30 with 1 refill. Follow-up in 3-4 weeks. We will likely have to titrate dose but want to make sure she tolerates well first.

## 2016-07-30 ENCOUNTER — Encounter: Payer: Self-pay | Admitting: Physician Assistant

## 2016-07-30 ENCOUNTER — Ambulatory Visit (INDEPENDENT_AMBULATORY_CARE_PROVIDER_SITE_OTHER): Payer: BLUE CROSS/BLUE SHIELD | Admitting: Physician Assistant

## 2016-07-30 VITALS — BP 100/60 | HR 58 | Temp 98.4°F | Resp 14 | Ht 68.0 in | Wt 224.0 lb

## 2016-07-30 DIAGNOSIS — F331 Major depressive disorder, recurrent, moderate: Secondary | ICD-10-CM

## 2016-07-30 DIAGNOSIS — H6120 Impacted cerumen, unspecified ear: Secondary | ICD-10-CM | POA: Insufficient documentation

## 2016-07-30 DIAGNOSIS — H6121 Impacted cerumen, right ear: Secondary | ICD-10-CM | POA: Diagnosis not present

## 2016-07-30 NOTE — Assessment & Plan Note (Signed)
Tolerating Citalopram well, already with improvement in mood. Discussed continuing same dose for a few more weeks to see what the full therapeutic effect will be before changing dose. She wishes to try this. Will continue same for now. She is to follow-up via phone in 1-2 weeks. F/U in office in 3 months.

## 2016-07-30 NOTE — Assessment & Plan Note (Signed)
Removed via irrigation in office. Repeat examination within normal limits. Patient notes resolution of all symptoms with removal of impaction. Home measures discussed with patient.

## 2016-07-30 NOTE — Patient Instructions (Signed)
Please continue Citalopram as directed We will see how symptoms are doing over the next two weeks on this continued dose. If not improving further, we will need to increase to a 20 mg daily dose.   Call me in 2 weeks to let me know how things are going. Follow-up with me in 3 months.

## 2016-07-30 NOTE — Progress Notes (Signed)
Pre visit review using our clinic review tool, if applicable. No additional management support is needed unless otherwise documented below in the visit note. 

## 2016-07-30 NOTE — Progress Notes (Signed)
Patient presents to clinic today for follow-up of depression/anxiety after being started on Citalopram 10 mg daily. Patient endorses taking medication daily as directed. Noted nausea with first couple of doses that has since resolved. Denies other noted side effect of medication. Has started noting an improvement in her mood. States even her family has committed on how much happier she seems. Is resting well at night and trying to eat a well-balanced diet. Denies SI/HI.  Patient also notes fullness and decreased hearing in R ear over the past few days. Denies ear pain or drainage. Denies other URI symptoms or symptoms of L ear.   Past Medical History:  Diagnosis Date  . Anemia    Iron Deficiency -- Diagnosed when she was a teenager.  . Depression    Diagnosed Fall 2016.    Current Outpatient Prescriptions on File Prior to Visit  Medication Sig Dispense Refill  . citalopram (CELEXA) 10 MG tablet Take 1 tablet (10 mg total) by mouth daily. 30 tablet 1   No current facility-administered medications on file prior to visit.     No Known Allergies  Family History  Problem Relation Age of Onset  . Cancer Maternal Grandfather        lung cancer  . Cancer Paternal Grandmother        skin cancer  . Diabetes Paternal Grandfather   . Hepatitis C Mother        On treatment  . Cancer Father        Skin  . Cancer Maternal Uncle     Social History   Social History  . Marital status: Single    Spouse name: N/A  . Number of children: 0  . Years of education: N/A   Occupational History  .      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 Smoking status: Never Smoker  . Smokeless tobacco: Never Used  . Alcohol use Yes     Comment: glass a wine about 3 times per month  . Drug use: No  . Sexual activity: No   Other Topics Concern  . None   Social History Narrative  . None   Review of Systems - See HPI.  All other ROS are negative.  BP 100/60    Pulse (!) 58   Temp 98.4 F (36.9 C) (Oral)   Resp 14   Ht 5\' 8"  (1.727 m)   Wt 224 lb (101.6 kg)   SpO2 98%   BMI 34.06 kg/m   Physical Exam  Constitutional: She is oriented to person, place, and time and well-developed, well-nourished, and in no distress.  HENT:  Head: Normocephalic and atraumatic.  Left Ear: Tympanic membrane normal.  Cerumen impaction noted of R ear canal.  Eyes: Conjunctivae are normal.  Neck: Neck supple.  Cardiovascular: Normal rate, regular rhythm, normal heart sounds and intact distal pulses.   Pulmonary/Chest: Effort normal and breath sounds normal. No respiratory distress. She has no wheezes. She has no rales. She exhibits no tenderness.  Neurological: She is alert and oriented to person, place, and time.  Skin: Skin is warm and dry. No rash noted.  Psychiatric: Affect normal.  Vitals reviewed.  Assessment/Plan: Cerumen impaction Removed via irrigation in office. Repeat examination within normal limits. Patient notes resolution of all symptoms with removal of impaction. Home measures discussed with patient.   Depression Tolerating Citalopram well, already with improvement in mood. Discussed continuing same dose for a few more  weeks to see what the full therapeutic effect will be before changing dose. She wishes to try this. Will continue same for now. She is to follow-up via phone in 1-2 weeks. F/U in office in 3 months.    Piedad Climes, PA-C

## 2016-08-05 ENCOUNTER — Ambulatory Visit: Payer: Self-pay | Admitting: Physician Assistant

## 2016-08-05 ENCOUNTER — Telehealth: Payer: Self-pay | Admitting: *Deleted

## 2016-08-05 DIAGNOSIS — F331 Major depressive disorder, recurrent, moderate: Secondary | ICD-10-CM

## 2016-08-05 MED ORDER — CITALOPRAM HYDROBROMIDE 20 MG PO TABS: 20.0000 mg | ORAL_TABLET | Freq: Every day | ORAL | 0 refills | Status: DC

## 2016-08-05 NOTE — Telephone Encounter (Signed)
Patient is aware of notes below. Medication has been called in for patient.    Appt Scheduled for 2 weeks out.

## 2016-08-05 NOTE — Telephone Encounter (Signed)
Patient states that she is not feeling any better as far as her mood since she was put on the 10mg  Citalopram. She is asking if this dosage can be increased.   She states especially the last 3 days she has just not been motivated, she's tired and dragging and she doesn't feel like the current dose is working.

## 2016-08-05 NOTE — Telephone Encounter (Signed)
Ok for her to increase to 20 mg daily -- can take 2 of current 10 mg tablets daily. Ok to send in 20 mg dose -- 30 with 0 refills. Follow-up with me in 2-3 weeks.

## 2016-08-19 ENCOUNTER — Encounter: Payer: Self-pay | Admitting: Physician Assistant

## 2016-08-19 ENCOUNTER — Ambulatory Visit (INDEPENDENT_AMBULATORY_CARE_PROVIDER_SITE_OTHER): Payer: BLUE CROSS/BLUE SHIELD | Admitting: Physician Assistant

## 2016-08-19 DIAGNOSIS — F331 Major depressive disorder, recurrent, moderate: Secondary | ICD-10-CM | POA: Diagnosis not present

## 2016-08-19 NOTE — Assessment & Plan Note (Signed)
Improved. For now will continue citalopram at 20 mg daily. Discussed trial of Pure ZZZs for sleep as it contains combination of melatonin, lavender, chamomile and lemon balm. If not helping will have to consider other options for sleep.

## 2016-08-19 NOTE — Patient Instructions (Addendum)
Please continue the Citalopram as directed.  I am glad you are doing better on this dose of medication.  Please stop the Melatonin and start the ZZZQuil Pure ZZZs nightly to see if this helps with sleep.   Follow-up with me in 3 months.    Earwax Buildup, Adult The ears produce a substance called earwax that helps keep bacteria out of the ear and protects the skin in the ear canal. Occasionally, earwax can build up in the ear and cause discomfort or hearing loss. What increases the risk? This condition is more likely to develop in people who:  Are female.  Are elderly.  Naturally produce more earwax.  Clean their ears often with cotton swabs.  Use earplugs often.  Use in-ear headphones often.  Wear hearing aids.  Have narrow ear canals.  Have earwax that is overly thick or sticky.  Have eczema.  Are dehydrated.  Have excess hair in the ear canal.  What are the signs or symptoms? Symptoms of this condition include:  Reduced or muffled hearing.  A feeling of fullness in the ear or feeling that the ear is plugged.  Fluid coming from the ear.  Ear pain.  Ear itch.  Ringing in the ear.  Coughing.  An obvious piece of earwax that can be seen inside the ear canal.  How is this diagnosed? This condition may be diagnosed based on:  Your symptoms.  Your medical history.  An ear exam. During the exam, your health care provider will look into your ear with an instrument called an otoscope.  You may have tests, including a hearing test. How is this treated? This condition may be treated by:  Using ear drops to soften the earwax.  Having the earwax removed by a health care provider. The health care provider may: ? Flush the ear with water. ? Use an instrument that has a loop on the end (curette). ? Use a suction device.  Surgery to remove the wax buildup. This may be done in severe cases.  Follow these instructions at home:  Take over-the-counter and  prescription medicines only as told by your health care provider.  Do not put any objects, including cotton swabs, into your ear. You can clean the opening of your ear canal with a washcloth or facial tissue.  Follow instructions from your health care provider about cleaning your ears. Do not over-clean your ears.  Drink enough fluid to keep your urine clear or pale yellow. This will help to thin the earwax.  Keep all follow-up visits as told by your health care provider. If earwax builds up in your ears often or if you use hearing aids, consider seeing your health care provider for routine, preventive ear cleanings. Ask your health care provider how often you should schedule your cleanings.  If you have hearing aids, clean them according to instructions from the manufacturer and your health care provider. Contact a health care provider if:  You have ear pain.  You develop a fever.  You have blood, pus, or other fluid coming from your ear.  You have hearing loss.  You have ringing in your ears that does not go away.  Your symptoms do not improve with treatment.  You feel like the room is spinning (vertigo). Summary  Earwax can build up in the ear and cause discomfort or hearing loss.  The most common symptoms of this condition include reduced or muffled hearing and a feeling of fullness in the ear or feeling  that the ear is plugged.  This condition may be diagnosed based on your symptoms, your medical history, and an ear exam.  This condition may be treated by using ear drops to soften the earwax or by having the earwax removed by a health care provider.  Do not put any objects, including cotton swabs, into your ear. You can clean the opening of your ear canal with a washcloth or facial tissue. This information is not intended to replace advice given to you by your health care provider. Make sure you discuss any questions you have with your health care provider. Document  Released: 02/20/2004 Document Revised: 03/25/2016 Document Reviewed: 03/25/2016 Elsevier Interactive Patient Education  [employer]2018 Elsevier Inc.

## 2016-08-19 NOTE — Progress Notes (Signed)
Pre visit review using our clinic review tool, if applicable. No additional management support is needed unless otherwise documented below in the visit note. 

## 2016-08-19 NOTE — Progress Notes (Signed)
   Patient presents to clinic today c/o for follow-up of depression after increase in citalopram from 10 mg to 20 mg daily. Patient endorses taking medication as directed. Is tolerating well without side effect. I started noticing significant improvement in her mood. Denies depressed mood or anhedonia most days of the week with this medicaiotn. Denies anxiety. Denies suicidal thought or ideation. Still having some issue falling asleep. It is not responding to over-the-counter melatonin. Patient has tried multiple over-the-counter medicines without relief. Wants to avoid prescription medications if possible.  Past Medical History:  Diagnosis Date  . Anemia    Iron Deficiency -- Diagnosed when she was a teenager.  . Depression    Diagnosed Fall 2016.    Current Outpatient Prescriptions on File Prior to Visit  Medication Sig Dispense Refill  . citalopram (CELEXA) 20 MG tablet Take 1 tablet (20 mg total) by mouth daily. 30 tablet 0  . Melatonin 5 MG TABS Take 1 tablet by mouth at bedtime.     No current facility-administered medications on file prior to visit.     No Known Allergies  Family History  Problem Relation Age of Onset  . Cancer Maternal Grandfather        lung cancer  . Cancer Paternal Grandmother        skin cancer  . Diabetes Paternal Grandfather   . Hepatitis C Mother        On treatment  . Cancer Father        Skin  . Cancer Maternal Uncle     Social History   Social History  . Marital status: Single    Spouse name: N/A  . Number of children: 0  . Years of education: N/A   Occupational History  .      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 Smoking status: Never Smoker  . Smokeless tobacco: Never Used  . Alcohol use Yes     Comment: glass a wine about 3 times per month  . Drug use: No  . Sexual activity: No   Other Topics Concern  . None   Social History Narrative  . None   Review of Systems - See HPI.  All other  ROS are negative.  BP 102/70   Pulse 70   Temp 97.7 F (36.5 C) (Oral)   Resp 14   Ht 5\' 8"  (1.727 m)   Wt 225 lb (102.1 kg)   SpO2 98%   BMI 34.21 kg/m   Physical Exam  Constitutional: She is oriented to person, place, and time and well-developed, well-nourished, and in no distress.  HENT:  Head: Normocephalic and atraumatic.  Eyes: Conjunctivae are normal.  Neck: Neck supple.  Cardiovascular: Normal rate, regular rhythm, normal heart sounds and intact distal pulses.   Pulmonary/Chest: Effort normal and breath sounds normal. No respiratory distress. She has no wheezes. She has no rales. She exhibits no tenderness.  Neurological: She is alert and oriented to person, place, and time.  Skin: Skin is warm and dry. No rash noted.  Psychiatric: Affect normal.  Vitals reviewed.  Assessment/Plan: Depression Improved. For now will continue citalopram at 20 mg daily. Discussed trial of Pure ZZZs for sleep as it contains combination of melatonin, lavender, chamomile and lemon balm. If not helping will have to consider other options for sleep.     Piedad ClimesMartin, Jonell Krontz Cody, PA-C

## 2016-09-03 ENCOUNTER — Other Ambulatory Visit: Payer: Self-pay | Admitting: Physician Assistant

## 2016-09-03 DIAGNOSIS — F331 Major depressive disorder, recurrent, moderate: Secondary | ICD-10-CM

## 2016-09-04 ENCOUNTER — Other Ambulatory Visit: Payer: Self-pay | Admitting: Physician Assistant

## 2016-09-04 DIAGNOSIS — F331 Major depressive disorder, recurrent, moderate: Secondary | ICD-10-CM

## 2016-09-04 MED ORDER — CITALOPRAM HYDROBROMIDE 20 MG PO TABS: 20.0000 mg | ORAL_TABLET | Freq: Every day | ORAL | 0 refills | Status: DC

## 2016-10-09 ENCOUNTER — Encounter: Payer: BLUE CROSS/BLUE SHIELD | Admitting: Physician Assistant

## 2016-10-16 ENCOUNTER — Encounter: Payer: Self-pay | Admitting: Physician Assistant

## 2016-10-16 ENCOUNTER — Ambulatory Visit (INDEPENDENT_AMBULATORY_CARE_PROVIDER_SITE_OTHER): Payer: BLUE CROSS/BLUE SHIELD | Admitting: Physician Assistant

## 2016-10-16 VITALS — BP 102/70 | HR 66 | Temp 97.9°F | Resp 14 | Ht 68.0 in | Wt 230.0 lb

## 2016-10-16 DIAGNOSIS — Z0000 Encounter for general adult medical examination without abnormal findings: Secondary | ICD-10-CM | POA: Diagnosis not present

## 2016-10-16 DIAGNOSIS — F331 Major depressive disorder, recurrent, moderate: Secondary | ICD-10-CM | POA: Diagnosis not present

## 2016-10-16 LAB — COMPREHENSIVE METABOLIC PANEL
ALK PHOS: 69 U/L (ref 39–117)
ALT: 20 U/L (ref 0–35)
AST: 19 U/L (ref 0–37)
Albumin: 4.4 g/dL (ref 3.5–5.2)
BILIRUBIN TOTAL: 0.3 mg/dL (ref 0.2–1.2)
BUN: 13 mg/dL (ref 6–23)
CO2: 30 mEq/L (ref 19–32)
CREATININE: 0.8 mg/dL (ref 0.40–1.20)
Calcium: 9.6 mg/dL (ref 8.4–10.5)
Chloride: 100 mEq/L (ref 96–112)
GFR: 92.2 mL/min (ref >60.00)
GLUCOSE: 78 mg/dL (ref 70–99)
Potassium: 4.3 mEq/L (ref 3.5–5.1)
Sodium: 138 mEq/L (ref 135–145)
TOTAL PROTEIN: 6.9 g/dL (ref 6.0–8.3)

## 2016-10-16 LAB — LIPID PANEL
CHOL/HDL RATIO: 4
Cholesterol: 199 mg/dL (ref 0–200)
HDL: 51.8 mg/dL (ref >39.00)
LDL CALC: 128 mg/dL — AB (ref 0–99)
NONHDL: 146.7
Triglycerides: 96 mg/dL (ref 0.0–149.0)
VLDL: 19.2 mg/dL (ref 0.0–40.0)

## 2016-10-16 LAB — CBC
HCT: 42.3 % (ref 36.0–46.0)
HEMOGLOBIN: 13.9 g/dL (ref 12.0–15.0)
MCHC: 32.8 g/dL (ref 30.0–36.0)
MCV: 90.5 fl (ref 78.0–100.0)
PLATELETS: 307 10*3/uL (ref 150.0–400.0)
RBC: 4.67 Mil/uL (ref 3.87–5.11)
RDW: 13.1 % (ref 11.5–15.5)
WBC: 10.8 10*3/uL — AB (ref 4.0–10.5)

## 2016-10-16 NOTE — Progress Notes (Signed)
Patient presents to clinic today for annual exam.  Patient is fasting for labs.  Diet -- Well-balanced diet overall. Does note that she will eat sweets from time to time. Has stopped sodas.  Exercise -- Has not been exercising regularly.   Depression -- Is currently on Citalopram 20 mg daily. Is taking as directed. Endorses some increased stressors at work that are affecting mood. Denies SI/HI. Denies panic attack. Notes some depressed mood despite medication. Is struggling with sleep because her schedule varies. She is working on getting into a better routine.   Health Maintenance: Immunizations -- Declines flu shot. Tetanus up-to-date. PAP -- Due. Patient has GYN appointment scheduled.   Past Medical History:  Diagnosis Date  . Anemia    Iron Deficiency -- Diagnosed when she was a teenager.  . Depression    Diagnosed Fall 2016.    Past Surgical History:  Procedure Laterality Date  . NO PAST SURGERIES      Current Outpatient Prescriptions on File Prior to Visit  Medication Sig Dispense Refill  . citalopram (CELEXA) 20 MG tablet Take 1 tablet (20 mg total) by mouth daily. 90 tablet 0  . Melatonin 10 MG TABS Take 1 tablet by mouth at bedtime.     No current facility-administered medications on file prior to visit.     No Known Allergies  Family History  Problem Relation Age of Onset  . Cancer Maternal Grandfather        lung cancer  . Cancer Paternal Grandmother        skin cancer  . Diabetes Paternal Grandfather   . Hepatitis C Mother        On treatment  . Cancer Father        Skin  . Cancer Maternal Uncle     Social History   Social History  . Marital status: Single    Spouse name: N/A  . Number of children: 0  . Years of education: N/A   Occupational History  .      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 Smoking status: Never Smoker  . Smokeless tobacco: Never Used  . Alcohol use Yes     Comment: glass a wine  about 3 times per month  . Drug use: No  . Sexual activity: No   Other Topics Concern  . Not on file   Social History Narrative  . No narrative on file   Review of Systems  Constitutional: Negative for fever and weight loss.  HENT: Negative for ear discharge, ear pain, hearing loss and tinnitus.   Eyes: Negative for blurred vision, double vision, photophobia and pain.  Respiratory: Negative for cough and shortness of breath.   Cardiovascular: Negative for chest pain and palpitations.  Gastrointestinal: Negative for abdominal pain, blood in stool, constipation, diarrhea, heartburn, melena, nausea and vomiting.  Genitourinary: Negative for dysuria, flank pain, frequency, hematuria and urgency.  Musculoskeletal: Negative for falls.  Neurological: Negative for dizziness, loss of consciousness and headaches.  Endo/Heme/Allergies: Negative for environmental allergies.  Psychiatric/Behavioral: Positive for depression. Negative for hallucinations, substance abuse and suicidal ideas. The patient is not nervous/anxious and does not have insomnia.     BP 102/70   Pulse 66   Temp 97.9 F (36.6 C) (Oral)   Resp 14   Ht  (1.727 m)   Wt 230 lb (104.3 kg)   LMP 10/10/2016   SpO2 98%   BMI 34.97 kg/m  Physical Exam  Constitutional: She is oriented to person, place, and time and well-developed, well-nourished, and in no distress.  HENT:  Head: Normocephalic and atraumatic.  Right Ear: Tympanic membrane, external ear and ear canal normal.  Left Ear: Tympanic membrane, external ear and ear canal normal.  Nose: Nose normal. No mucosal edema.  Mouth/Throat: Uvula is midline, oropharynx is clear and moist and mucous membranes are normal. No oropharyngeal exudate or posterior oropharyngeal erythema.  Eyes: Pupils are equal, round, and reactive to light. Conjunctivae are normal.  Neck: Neck supple. No thyromegaly present.  Cardiovascular: Normal rate, regular rhythm, normal heart sounds  and intact distal pulses.   Pulmonary/Chest: Effort normal and breath sounds normal. No respiratory distress. She has no wheezes. She has no rales.  Abdominal: Soft. Bowel sounds are normal. She exhibits no distension and no mass. There is no tenderness. There is no rebound and no guarding.  Lymphadenopathy:    She has no cervical adenopathy.  Neurological: She is alert and oriented to person, place, and time. No cranial nerve deficit.  Skin: Skin is warm and dry. No rash noted.  Psychiatric: Affect normal.  Vitals reviewed.  Assessment/Plan: Visit for preventive health examination Depression screen negative. Health Maintenance reviewed -- PAP due -- has GYN appointment. Preventive schedule discussed and handout given in AVS. Will obtain fasting labs today.   Depression Stable. Some breakthrough symptoms. Feel sleep-related. Sleep hygiene reviewed. Start Pure ZZZs. Follow-up discussed.    Piedad Climes, PA-C

## 2016-10-16 NOTE — Assessment & Plan Note (Signed)
Depression screen negative. Health Maintenance reviewed -- PAP due -- has GYN appointment. Preventive schedule discussed and handout given in AVS. Will obtain fasting labs today.

## 2016-10-16 NOTE — Assessment & Plan Note (Signed)
Stable. Some breakthrough symptoms. Feel sleep-related. Sleep hygiene reviewed. Start Pure ZZZs. Follow-up discussed.

## 2016-10-16 NOTE — Patient Instructions (Signed)
Please go to the lab for blood work.   Our office will call you with your results unless you have chosen to receive results via MyChart.  If your blood work is normal we will follow-up each year for physicals and as scheduled for chronic medical problems.  If anything is abnormal we will treat accordingly and get you in for a follow-up.   Preventive Care 18-39 Years, Female Preventive care refers to lifestyle choices and visits with your health care provider that can promote health and wellness. What does preventive care include?  A yearly physical exam. This is also called an annual well check.  Dental exams once or twice a year.  Routine eye exams. Ask your health care provider how often you should have your eyes checked.  Personal lifestyle choices, including: ? Daily care of your teeth and gums. ? Regular physical activity. ? Eating a healthy diet. ? Avoiding tobacco and drug use. ? Limiting alcohol use. ? Practicing safe sex. ? Taking vitamin and mineral supplements as recommended by your health care provider. What happens during an annual well check? The services and screenings done by your health care provider during your annual well check will depend on your age, overall health, lifestyle risk factors, and family history of disease. Counseling Your health care provider may ask you questions about your:  Alcohol use.  Tobacco use.  Drug use.  Emotional well-being.  Home and relationship well-being.  Sexual activity.  Eating habits.  Work and work Statistician.  Method of birth control.  Menstrual cycle.  Pregnancy history.  Screening You may have the following tests or measurements:  Height, weight, and BMI.  Diabetes screening. This is done by checking your blood sugar (glucose) after you have not eaten for a while (fasting).  Blood pressure.  Lipid and cholesterol levels. These may be checked every 5 years starting at age 55.  Skin  check.  Hepatitis C blood test.  Hepatitis B blood test.  Sexually transmitted disease (STD) testing.  BRCA-related cancer screening. This may be done if you have a family history of breast, ovarian, tubal, or peritoneal cancers.  Pelvic exam and Pap test. This may be done every 3 years starting at age 46. Starting at age 12, this may be done every 5 years if you have a Pap test in combination with an HPV test.  Discuss your test results, treatment options, and if necessary, the need for more tests with your health care provider. Vaccines Your health care provider may recommend certain vaccines, such as:  Influenza vaccine. This is recommended every year.  Tetanus, diphtheria, and acellular pertussis (Tdap, Td) vaccine. You may need a Td booster every 10 years.  Varicella vaccine. You may need this if you have not been vaccinated.  HPV vaccine. If you are 39 or younger, you may need three doses over 6 months.  Measles, mumps, and rubella (MMR) vaccine. You may need at least one dose of MMR. You may also need a second dose.  Pneumococcal 13-valent conjugate (PCV13) vaccine. You may need this if you have certain conditions and were not previously vaccinated.  Pneumococcal polysaccharide (PPSV23) vaccine. You may need one or two doses if you smoke cigarettes or if you have certain conditions.  Meningococcal vaccine. One dose is recommended if you are age 13-21 years and a first-year college student living in a residence hall, or if you have one of several medical conditions. You may also need additional booster doses.  Hepatitis A vaccine.  You may need this if you have certain conditions or if you travel or work in places where you may be exposed to hepatitis A.  Hepatitis B vaccine. You may need this if you have certain conditions or if you travel or work in places where you may be exposed to hepatitis B.  Haemophilus influenzae type b (Hib) vaccine. You may need this if you have  certain risk factors.  Talk to your health care provider about which screenings and vaccines you need and how often you need them. This information is not intended to replace advice given to you by your health care provider. Make sure you discuss any questions you have with your health care provider. Document Released: 03/10/2001 Document Revised: 10/02/2015 Document Reviewed: 11/13/2014 Elsevier Interactive Patient Education  2017 [employer] .      

## 2016-10-16 NOTE — Progress Notes (Signed)
Pre visit review using our clinic review tool, if applicable. No additional management support is needed unless otherwise documented below in the visit note. 

## 2016-10-19 ENCOUNTER — Encounter: Payer: Self-pay | Admitting: Emergency Medicine

## 2016-10-29 ENCOUNTER — Other Ambulatory Visit (HOSPITAL_COMMUNITY)
Admission: RE | Admit: 2016-10-29 | Discharge: 2016-10-29 | Disposition: A | Discharge status: Home / Self Care | Payer: BLUE CROSS/BLUE SHIELD | Source: Ambulatory Visit | Attending: Certified Nurse Midwife | Admitting: Certified Nurse Midwife

## 2016-10-29 ENCOUNTER — Ambulatory Visit (INDEPENDENT_AMBULATORY_CARE_PROVIDER_SITE_OTHER): Payer: BLUE CROSS/BLUE SHIELD | Admitting: Certified Nurse Midwife

## 2016-10-29 ENCOUNTER — Encounter: Payer: Self-pay | Admitting: Certified Nurse Midwife

## 2016-10-29 VITALS — BP 104/66 | HR 72 | Resp 16 | Ht 68.25 in | Wt 231.0 lb

## 2016-10-29 DIAGNOSIS — Z0000 Encounter for general adult medical examination without abnormal findings: Secondary | ICD-10-CM | POA: Diagnosis not present

## 2016-10-29 DIAGNOSIS — Z01419 Encounter for gynecological examination (general) (routine) without abnormal findings: Secondary | ICD-10-CM | POA: Diagnosis not present

## 2016-10-29 DIAGNOSIS — R87610 Atypical squamous cells of undetermined significance on cytologic smear of cervix (ASC-US): Secondary | ICD-10-CM | POA: Insufficient documentation

## 2016-10-29 DIAGNOSIS — F3289 Other specified depressive episodes: Secondary | ICD-10-CM

## 2016-10-29 DIAGNOSIS — Z124 Encounter for screening for malignant neoplasm of cervix: Secondary | ICD-10-CM | POA: Insufficient documentation

## 2016-10-29 DIAGNOSIS — Z831 Family history of other infectious and parasitic diseases: Secondary | ICD-10-CM

## 2016-10-29 NOTE — Patient Instructions (Signed)

## 2016-10-29 NOTE — Progress Notes (Addendum)
26 y.o. G0P0 Single  Caucasian Fe here for annual exam. Periods normal,no issues. Busy with work and life. Not sexually active. Sees MD for depression management and is now on Celexa, which seems to be working well. Exercises regularly.  Working on weight loss now. Desires screening labs. No other health concerns today.   Patient's last menstrual period was 10/09/2016 (exact date).          Sexually active: No.  The current method of family planning is abstinence.    Exercising: Yes.    walk, jog Smoker:  no  Health Maintenance: Pap:  09-08-13 neg History of Abnormal Pap: no MMG:  none Self Breast exams: yes Colonoscopy:  none BMD:   none TDaP:  2013 Shingles: no Pneumonia: no Hep C and HIV: HIV neg 2016 Labs: yes   reports that she has never smoked. She has never used smokeless tobacco. She reports that she drinks alcohol. She reports that she does not use drugs.  Past Medical History:  Diagnosis Date  . Anemia    Iron Deficiency -- Diagnosed when she was a teenager.  . Depression    Diagnosed Fall 2016.    Past Surgical History:  Procedure Laterality Date  . NO PAST SURGERIES      Current Outpatient Prescriptions  Medication Sig Dispense Refill  . cetirizine (ZYRTEC) 5 MG tablet Take 5 mg by mouth daily.    . citalopram (CELEXA) 20 MG tablet Take 1 tablet (20 mg total) by mouth daily. 90 tablet 0  . Melatonin 10 MG TABS Take 1 tablet by mouth at bedtime.     No current facility-administered medications for this visit.     Family History  Problem Relation Age of Onset  . Cancer Maternal Grandfather        lung cancer  . Cancer Paternal Grandmother        skin cancer  . Diabetes Paternal Grandfather   . Hepatitis C Mother        On treatment  . Cancer Father        Skin  . Cancer Maternal Uncle     ROS:  Pertinent items are noted in HPI.  Otherwise, a comprehensive ROS was negative.  Exam:   BP 104/66   Pulse 72   Resp 16   Ht 5' 8.25" (1.734 m)   Wt  231 lb (104.8 kg)   LMP 10/09/2016 (Exact Date)   BMI 34.87 kg/m  Height: 5' 8.25" (173.4 cm) Ht Readings from Last 3 Encounters:  10/29/16 5' 8.25" (1.734 m)  10/16/16  (1.727 m)  08/19/16  (1.727 m)    General appearance: alert, cooperative and appears stated age Head: Normocephalic, without obvious abnormality, atraumatic Neck: no adenopathy, supple, symmetrical, trachea midline and thyroid normal to inspection and palpation Lungs: clear to auscultation bilaterally Breasts: normal appearance, no masses or tenderness, No nipple retraction or dimpling, No nipple discharge or bleeding, No axillary or supraclavicular adenopathy Heart: regular rate and rhythm Abdomen: soft, non-tender; no masses,  no organomegaly Extremities: extremities normal, atraumatic, no cyanosis or edema Skin: Skin color, texture, turgor normal. No rashes or lesions Lymph nodes: Cervical, supraclavicular, and axillary nodes normal. No abnormal inguinal nodes palpated Neurologic: Grossly normal   Pelvic: External genitalia:  no lesions              Urethra:  normal appearing urethra with no masses, tenderness or lesions  Bartholin's and Skene's: normal                 Vagina: normal appearing vagina with normal color and discharge, no lesions              Cervix: no cervical motion tenderness, no lesions and nulliparous appearance              Pap taken: Yes.   Bimanual Exam:  Uterus:  normal size, contour, position, consistency, mobility, non-tender and anteverted              Adnexa: normal adnexa and no mass, fullness, tenderness               Rectovaginal: Confirms               Anus:  normal appearance  Chaperone present: yes  A:  Well Woman with normal exam  Contraception none needed, not sexually active  Depression well controlled with PCP management  Screening labs  P:   Reviewed health and wellness pertinent to exam  Will advise if needed  Continue follow up with MD as  indicated  Labs: CBC, Hep C(mother in treatment for)  TSH  Vitamin D  Pap smear: no   counseled on breast self exam, STD prevention, HIV risk factors and prevention, adequate intake of calcium and vitamin D, diet and exercise Wished well on her mission trip. return annually or prn  An After Visit Summary was printed and given to the patient.

## 2016-10-30 ENCOUNTER — Encounter: Payer: Self-pay | Admitting: Certified Nurse Midwife

## 2016-10-30 LAB — CBC
HEMATOCRIT: 40.3 % (ref 34.0–46.6)
Hemoglobin: 13.7 g/dL (ref 11.1–15.9)
MCH: 30 pg (ref 26.6–33.0)
MCHC: 34 g/dL (ref 31.5–35.7)
MCV: 88 fL (ref 79–97)
PLATELETS: 274 10*3/uL (ref 150–379)
RBC: 4.57 x10E6/uL (ref 3.77–5.28)
RDW: 13.7 % (ref 12.3–15.4)
WBC: 8.4 10*3/uL (ref 3.4–10.8)

## 2016-10-30 LAB — HEPATITIS C ANTIBODY: Hep C Virus Ab: <0.1 s/co ratio (ref 0.0–0.9)

## 2016-10-30 LAB — TSH: TSH: 3.28 u[IU]/mL (ref 0.450–4.500)

## 2016-10-30 LAB — VITAMIN D 25 HYDROXY (VIT D DEFICIENCY, FRACTURES): Vit D, 25-Hydroxy: 28.6 ng/mL — ABNORMAL LOW (ref 30.0–100.0)

## 2016-11-02 ENCOUNTER — Other Ambulatory Visit: Payer: Self-pay | Admitting: Certified Nurse Midwife

## 2016-11-02 DIAGNOSIS — E559 Vitamin D deficiency, unspecified: Secondary | ICD-10-CM

## 2016-11-02 LAB — CYTOLOGY - PAP
Diagnosis: UNDETERMINED — AB
HPV (WINDOPATH): NOT DETECTED

## 2016-11-04 ENCOUNTER — Other Ambulatory Visit: Payer: Self-pay | Admitting: Certified Nurse Midwife

## 2016-11-20 ENCOUNTER — Ambulatory Visit: Payer: BLUE CROSS/BLUE SHIELD | Admitting: Physician Assistant

## 2016-11-20 DIAGNOSIS — Z0289 Encounter for other administrative examinations: Secondary | ICD-10-CM

## 2016-11-23 ENCOUNTER — Other Ambulatory Visit: Payer: Self-pay | Admitting: Physician Assistant

## 2016-11-23 DIAGNOSIS — F331 Major depressive disorder, recurrent, moderate: Secondary | ICD-10-CM

## 2016-11-24 MED ORDER — CITALOPRAM HYDROBROMIDE 20 MG PO TABS: 20.0000 mg | ORAL_TABLET | Freq: Every day | ORAL | 1 refills | Status: DC

## 2017-03-17 ENCOUNTER — Encounter: Payer: Self-pay | Admitting: Emergency Medicine

## 2017-09-12 ENCOUNTER — Other Ambulatory Visit: Payer: Self-pay | Admitting: Physician Assistant

## 2017-09-12 DIAGNOSIS — F331 Major depressive disorder, recurrent, moderate: Secondary | ICD-10-CM

## 2018-01-11 ENCOUNTER — Encounter: Payer: BLUE CROSS/BLUE SHIELD | Admitting: Physician Assistant

## 2018-01-11 DIAGNOSIS — Z0289 Encounter for other administrative examinations: Secondary | ICD-10-CM

## 2018-02-13 DIAGNOSIS — J0100 Acute maxillary sinusitis, unspecified: Secondary | ICD-10-CM | POA: Diagnosis not present

## 2018-02-13 DIAGNOSIS — H66001 Acute suppurative otitis media without spontaneous rupture of ear drum, right ear: Secondary | ICD-10-CM | POA: Diagnosis not present

## 2018-02-18 ENCOUNTER — Encounter: Payer: Self-pay | Admitting: Physician Assistant

## 2018-02-18 ENCOUNTER — Other Ambulatory Visit: Payer: Self-pay

## 2018-02-18 ENCOUNTER — Ambulatory Visit (INDEPENDENT_AMBULATORY_CARE_PROVIDER_SITE_OTHER): Payer: BLUE CROSS/BLUE SHIELD | Admitting: Physician Assistant

## 2018-02-18 ENCOUNTER — Other Ambulatory Visit: Payer: Self-pay | Admitting: Physician Assistant

## 2018-02-18 VITALS — BP 104/78 | Temp 97.6°F | Resp 16 | Ht 68.0 in | Wt 247.0 lb

## 2018-02-18 DIAGNOSIS — F3289 Other specified depressive episodes: Secondary | ICD-10-CM | POA: Diagnosis not present

## 2018-02-18 LAB — TSH: TSH: 2.07 u[IU]/mL (ref 0.35–4.50)

## 2018-02-18 MED ORDER — CITALOPRAM HYDROBROMIDE 40 MG PO TABS: 40.0000 mg | ORAL_TABLET | Freq: Every day | ORAL | 3 refills | Status: DC

## 2018-02-18 NOTE — Progress Notes (Signed)
Patient presents to clinic today c/o deterioration in her mood over the past few months despite continuing on Citalopram 20 mg daily. She was previously well-controlled on this regimen. Notes anhedonia, difficulty sleeping on occasional. Denies suicidal thoughts but occasionally does feel that things would be better if she were not around but states this was fleeting.   Decreased energy for past few months. (in the past with exercise was feeling great and energetic)  Stressors= job stress, finances, internal pressure to succeed,- have increased in past year  Counseling - is open to establishing counseling relationship as her previous therapist is only available through Skype.     Past Medical History:  Diagnosis Date  . Anemia    Iron Deficiency -- Diagnosed when she was a teenager.  . Depression    Diagnosed Fall 2016.    Current Outpatient Medications on File Prior to Visit  Medication Sig Dispense Refill  . amoxicillin-clavulanate (AUGMENTIN) 875-125 MG tablet Take by mouth.    Lucila Maine 575 MG/5ML SYRP Take 1 mL by mouth daily as needed.    . Melatonin 10 MG TABS Take 1 tablet by mouth at bedtime.     No current facility-administered medications on file prior to visit.     No Known Allergies  Family History  Problem Relation Age of Onset  . Cancer Maternal Grandfather        lung cancer  . Cancer Paternal Grandmother        skin cancer  . Diabetes Paternal Grandfather   . Hepatitis C Mother        On treatment  . Cancer Father        Skin  . Cancer Maternal Uncle     Social History   Socioeconomic History  . Marital status: Single    Spouse name: Not on file  . Number of children: 0  . Years of education: Not on file  . 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 Financial resource strain: Not on file  . Food insecurity:    Worry: Not on file    Inability: Not on file   . Transportation needs:    Medical: Not on file    Non-medical: Not on file  Tobacco Use  . Smoking status: Never Smoker  . Smokeless tobacco: Never Used  Substance and Sexual Activity  . Alcohol use: Yes    Alcohol/week: 0.0 - 2.0 standard drinks  . Drug use: No  . Sexual activity: Never    Partners: Male    Birth control/protection: Abstinence  Lifestyle  . Physical activity:    Days per week: Not on file    Minutes per session: Not on file  . Stress: Not on file  Relationships  . Social connections:    Talks on phone: Not on file    Gets together: Not on file    Attends religious service: Not on file    Active member of club or organization: Not on file    Attends meetings of clubs or organizations: Not on file    Relationship status: Not on file  Other Topics Concern  . Not on file  Social History Narrative  . Not on file   Review of Systems - See HPI.  All other ROS are negative.  BP 104/78   Temp 97.6 F (36.4 C) (Oral)   Resp 16   Ht 5\' 8"  (1.727  m)   Wt 247 lb (112 kg)   BMI 37.56 kg/m   Physical Exam Neck:     Musculoskeletal: Full passive range of motion without pain.     Thyroid: No thyroid mass, thyromegaly or thyroid tenderness.  Psychiatric:        Attention and Perception: Attention normal.        Mood and Affect: Mood is depressed.        Speech: Speech normal.        Thought Content: Thought content does not include suicidal plan.    Assessment/Plan: Depression Will increase Citalopram to 40 mg daily. Handout on counseling given to patient to schedule appointment. Continue diet and exercise to help with stress outlet. Mindfulness meditation recommended. Check TSH today. Follow-up scheduled.    Piedad Climes, PA-C

## 2018-02-18 NOTE — Patient Instructions (Signed)
Please start the new dose of Citalopram daily. A prescription has been sent in.  You can take two tablets of the 20 mg dose until you run out so you do not waste any pills. Use the Handout given to schedule an appointment with a new counselor. I have highlighted some recommendations.   Follow-up with me in 3-4 weeks. Return immediately for any worsening symptoms.   If you have any thoughts of harming yourself, please go to [facility] ER.

## 2018-02-18 NOTE — Assessment & Plan Note (Signed)
Will increase Citalopram to 40 mg daily. Handout on counseling given to patient to schedule appointment. Continue diet and exercise to help with stress outlet. Mindfulness meditation recommended. Check TSH today. Follow-up scheduled.

## 2018-03-11 ENCOUNTER — Ambulatory Visit: Payer: BLUE CROSS/BLUE SHIELD | Admitting: Physician Assistant

## 2018-03-14 ENCOUNTER — Other Ambulatory Visit: Payer: Self-pay

## 2018-03-14 ENCOUNTER — Ambulatory Visit: Payer: BLUE CROSS/BLUE SHIELD | Admitting: Physician Assistant

## 2018-03-14 ENCOUNTER — Encounter: Payer: Self-pay | Admitting: Physician Assistant

## 2018-03-14 VITALS — BP 108/70 | HR 59 | Temp 97.7°F | Resp 14 | Ht 68.0 in | Wt 243.0 lb

## 2018-03-14 DIAGNOSIS — F3289 Other specified depressive episodes: Secondary | ICD-10-CM

## 2018-03-14 DIAGNOSIS — S86912A Strain of unspecified muscle(s) and tendon(s) at lower leg level, left leg, initial encounter: Secondary | ICD-10-CM

## 2018-03-14 NOTE — Patient Instructions (Signed)
Continue the current dose of Citalopram, taking as directed. Keep active and try to take time in your day to meditate and de-stress.  For the foot -- please keep iced and elevated. Apply ace wrap around the lower leg. Wear supportive footwear. Gentle stretches but no significant workouts or any resistance training. Ibuprofen 600 mg three times daily as needed with food to help with inflammation. If not easing up in the next 1-2 weeks, will need Sports Medicine assessment.   For ear -- start OTC Flonase nasal spray over the next 4-5 days to see if this helps.   For sleep -- see below.  Sleep Hygiene  Do: (1) Go to bed at the same time each day. (2) Get up from bed at the same time each day. (3) Get regular exercise each day, preferably in the morning.  There is goof evidence that regular exercise improves restful sleep.  This includes stretching and aerobic exercise. (4) Get regular exposure to outdoor or bright lights, especially in the late afternoon. (5) Keep the temperature in your bedroom comfortable. (6) Keep the bedroom quiet when sleeping. (7) Keep the bedroom dark enough to facilitate sleep. (8) Use your bed only for sleep and sex. (9) Take medications as directed.  It is helpful to take prescribed sleeping pills 1 hour before bedtime, so they are causing drowsiness when you lie down, or 10 hours before getting up, to avoid daytime drowsiness. (10) Use a relaxation exercise just before going to sleep -- imagery, massage, warm bath. (11) Keep your feet and hands warm.  Wear warm socks and/or mittens or gloves to bed.  Don't: (1) Exercise just before going to bed. (2) Engage in stimulating activity just before bed, such as playing a competitive game, watching an exciting program on television, or having an important discussion with a loved one. (3) Have caffeine in the evening (coffee, teas, chocolate, sodas, etc.) (4) Read or watch television in bed. (5) Use alcohol to help you  sleep. (6) Go to bed too hungry or too full. (7) Take another person's sleeping pills. (8) Take over-the-counter sleeping pills, without your doctor's knowledge.  Tolerance can develop rapidly with these medications.  Diphenhydramine can have serious side effects for elderly patients. (9) Take daytime naps. (10) Command yourself to go to sleep.  This only makes your mind and body more alert.  If you lie awake for more than 20-30 minutes, get up, go to a different room, participate in a quiet activity (Ex - non-excitable reading or television), and then return to bed when you feel sleepy.  Do this as many times during the night as needed.  This may cause you to have a night or two of poor sleep but it will train your brain to know when it is time for sleep.

## 2018-03-14 NOTE — Progress Notes (Signed)
Patient presents to clinic today for follow-up of depression. At last visit, her Citalopram was increased to 40 mg daily. Patient endorses taking medications as directed. Has already started noting improvement in mood. Sleep and appetite are better overall. Still waking up occasionally in the middle of the night, taking an hour or so to fall asleep. Denies SI/HI, panic attack.   Patient also endorses left lateral distal extremity pain over the past week.Pain is aching. Denies swelling,bruising, redness, numbness or tingling. Denies noted trauma.  Is working out with a Psychologist, educational and thinks she may have overdone things. She has cut back on activity, iced and elevated extremity.   Past Medical History:  Diagnosis Date  . Anemia    Iron Deficiency -- Diagnosed when she was a teenager.  . Depression    Diagnosed Fall 2016.    Current Outpatient Medications on File Prior to Visit  Medication Sig Dispense Refill  . citalopram (CELEXA) 40 MG tablet Take 1 tablet (40 mg total) by mouth daily. 30 tablet 3  . Elderberry 575 MG/5ML SYRP Take 1 mL by mouth daily as needed.    . Melatonin 10 MG TABS Take 1 tablet by mouth at bedtime.     No current facility-administered medications on file prior to visit.     No Known Allergies  Family History  Problem Relation Age of Onset  . Cancer Maternal Grandfather        lung cancer  . Cancer Paternal Grandmother        skin cancer  . Diabetes Paternal Grandfather   . Hepatitis C Mother        On treatment  . Cancer Father        Skin  . Cancer Maternal Uncle     Social History   Socioeconomic History  . Marital status: Single    Spouse name: Not on file  . Number of children: 0  . Years of education: Not on file  . 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 Financial resource strain: Not on file  . Food insecurity:    Worry: Not on file    Inability:  Not on file  . Transportation needs:    Medical: Not on file    Non-medical: Not on file  Tobacco Use  . Smoking status: Never Smoker  . Smokeless tobacco: Never Used  Substance and Sexual Activity  . Alcohol use: Yes    Alcohol/week: 0.0 - 2.0 standard drinks  . Drug use: No  . Sexual activity: Never    Partners: Male    Birth control/protection: Abstinence  Lifestyle  . Physical activity:    Days per week: Not on file    Minutes per session: Not on file  . Stress: Not on file  Relationships  . Social connections:    Talks on phone: Not on file    Gets together: Not on file    Attends religious service: Not on file    Active member of club or organization: Not on file    Attends meetings of clubs or organizations: Not on file    Relationship status: Not on file  Other Topics Concern  . Not on file  Social History Narrative  . Not on file   Review of Systems - See HPI.  All other ROS are negative.  BP 108/70   Pulse (!) 59   Temp 97.7 F (  36.5 C) (Oral)   Resp 14   Ht 5\' 8"  (1.727 m)   Wt 243 lb (110.2 kg)   SpO2 98%   BMI 36.95 kg/m   Physical Exam Vitals signs reviewed.  Constitutional:      Appearance: Normal appearance.  HENT:     Head: Normocephalic and atraumatic.     Right Ear: Tympanic membrane normal.     Left Ear: Tympanic membrane normal.     Nose: Nose normal.  Neck:     Musculoskeletal: Neck supple.  Cardiovascular:     Rate and Rhythm: Normal rate and regular rhythm.     Heart sounds: Normal heart sounds.  Pulmonary:     Effort: Pulmonary effort is normal.     Breath sounds: Normal breath sounds.  Musculoskeletal:     Comments: Pain and tenderness with palpation over the distal peronus longus tendon. No gross abnormality noted. ROM preserved. Exam negative for skin changes. Extremity is neurovascularly intact.  Neurological:     Mental Status: She is alert.    Recent Results (from the past 2160 hour(s))  TSH     Status: None    Collection Time: 02/18/18 10:07 AM  Result Value Ref Range   TSH 2.07 0.35 - 4.50 uIU/mL    Assessment/Plan: 1. Other depression Improving. Sleep hygiene reviewed. Handout given. Continue Citalopram 40 mg as it should be getting to full effect in the next few weeks and we have already noted significant improvement. Follow-up scheduled.  2. Muscle strain of left lower leg, initial encounter RICE. Ibuprofen 600 mg Q8H PRN. Supportive footwear. Gentle stretching reviewed.    Piedad Climes, PA-C

## 2018-03-17 ENCOUNTER — Other Ambulatory Visit: Payer: Self-pay | Admitting: Physician Assistant

## 2018-03-17 MED ORDER — CITALOPRAM HYDROBROMIDE 40 MG PO TABS: 40.0000 mg | ORAL_TABLET | Freq: Every day | ORAL | 1 refills | Status: DC

## 2018-04-07 ENCOUNTER — Other Ambulatory Visit: Payer: Self-pay | Admitting: Physician Assistant

## 2018-04-07 DIAGNOSIS — F331 Major depressive disorder, recurrent, moderate: Secondary | ICD-10-CM

## 2018-06-09 DIAGNOSIS — M9901 Segmental and somatic dysfunction of cervical region: Secondary | ICD-10-CM | POA: Diagnosis not present

## 2018-06-14 DIAGNOSIS — M9901 Segmental and somatic dysfunction of cervical region: Secondary | ICD-10-CM | POA: Diagnosis not present

## 2018-06-15 ENCOUNTER — Ambulatory Visit: Payer: BLUE CROSS/BLUE SHIELD | Admitting: Physician Assistant

## 2018-06-21 DIAGNOSIS — M9901 Segmental and somatic dysfunction of cervical region: Secondary | ICD-10-CM | POA: Diagnosis not present

## 2018-06-30 DIAGNOSIS — M9901 Segmental and somatic dysfunction of cervical region: Secondary | ICD-10-CM | POA: Diagnosis not present

## 2018-07-14 DIAGNOSIS — M9901 Segmental and somatic dysfunction of cervical region: Secondary | ICD-10-CM | POA: Diagnosis not present

## 2018-07-29 DIAGNOSIS — Z20828 Contact with and (suspected) exposure to other viral communicable diseases: Secondary | ICD-10-CM | POA: Diagnosis not present

## 2018-08-10 DIAGNOSIS — M9901 Segmental and somatic dysfunction of cervical region: Secondary | ICD-10-CM | POA: Diagnosis not present

## 2018-09-01 DIAGNOSIS — M9901 Segmental and somatic dysfunction of cervical region: Secondary | ICD-10-CM | POA: Diagnosis not present

## 2018-09-07 DIAGNOSIS — F332 Major depressive disorder, recurrent severe without psychotic features: Secondary | ICD-10-CM | POA: Diagnosis not present

## 2018-09-19 DIAGNOSIS — F332 Major depressive disorder, recurrent severe without psychotic features: Secondary | ICD-10-CM | POA: Diagnosis not present

## 2018-09-28 DIAGNOSIS — M9901 Segmental and somatic dysfunction of cervical region: Secondary | ICD-10-CM | POA: Diagnosis not present

## 2018-09-28 DIAGNOSIS — F332 Major depressive disorder, recurrent severe without psychotic features: Secondary | ICD-10-CM | POA: Diagnosis not present

## 2018-10-11 ENCOUNTER — Other Ambulatory Visit: Payer: Self-pay | Admitting: Physician Assistant

## 2018-10-14 DIAGNOSIS — F332 Major depressive disorder, recurrent severe without psychotic features: Secondary | ICD-10-CM | POA: Diagnosis not present

## 2018-10-20 DIAGNOSIS — F332 Major depressive disorder, recurrent severe without psychotic features: Secondary | ICD-10-CM | POA: Diagnosis not present

## 2018-10-25 DIAGNOSIS — F332 Major depressive disorder, recurrent severe without psychotic features: Secondary | ICD-10-CM | POA: Diagnosis not present

## 2018-10-31 DIAGNOSIS — F332 Major depressive disorder, recurrent severe without psychotic features: Secondary | ICD-10-CM | POA: Diagnosis not present

## 2018-11-18 ENCOUNTER — Encounter: Payer: Self-pay | Admitting: Physician Assistant

## 2018-11-18 ENCOUNTER — Other Ambulatory Visit: Payer: Self-pay

## 2018-11-18 ENCOUNTER — Ambulatory Visit (INDEPENDENT_AMBULATORY_CARE_PROVIDER_SITE_OTHER): Payer: BLUE CROSS/BLUE SHIELD | Admitting: Physician Assistant

## 2018-11-18 VITALS — BP 104/64 | HR 68 | Temp 96.3°F | Resp 16 | Ht 68.0 in | Wt 252.0 lb

## 2018-11-18 DIAGNOSIS — F3289 Other specified depressive episodes: Secondary | ICD-10-CM

## 2018-11-18 DIAGNOSIS — Z0000 Encounter for general adult medical examination without abnormal findings: Secondary | ICD-10-CM | POA: Diagnosis not present

## 2018-11-18 DIAGNOSIS — R635 Abnormal weight gain: Secondary | ICD-10-CM

## 2018-11-18 NOTE — Progress Notes (Signed)
Patient presents to clinic today for annual exam.  Acute Concerns: Weight --Patient endorses continued weight gain despite quite significant TLC. Endorses diet is mainly plant based, occasional chicken and fish, incorporates vegetables, protein shakes, chickpea pastas, watches carbohydrate intake Avoids dairy, drinks almond milk  Physical activity includes running 1-2x/week, daily walks, HIIT exercises 5-6 days per week  Drinks 1 gallon of water per day, 1-2 cups of coffee/day   Chronic Issues: Depression -- better than usual, has been seeing counselor weekly/biweekly. Managed well with Celexa 40 mg. Denies breakthrough symptoms.   Health Maintenance: Immunizations -- declines flu vaccine today  PAP -- Endorses UTD. Will need records from GYN.  Past Medical History:  Diagnosis Date  . Anemia    Iron Deficiency -- Diagnosed when she was a teenager.  . Depression    Diagnosed Fall 2016.    Past Surgical History:  Procedure Laterality Date  . NO PAST SURGERIES      Current Outpatient Medications on File Prior to Visit  Medication Sig Dispense Refill  . citalopram (CELEXA) 40 MG tablet TAKE 1 TABLET BY MOUTH EVERY DAY 90 tablet 1  . Elderberry 575 MG/5ML SYRP Take 1 mL by mouth daily as needed.    . Melatonin 10 MG TABS Take 1 tablet by mouth at bedtime.     No current facility-administered medications on file prior to visit.     No Known Allergies  Family History  Problem Relation Age of Onset  . Cancer Maternal Grandfather        lung cancer  . Cancer Paternal Grandmother        skin cancer  . Diabetes Paternal Grandfather   . Hepatitis C Mother        On treatment  . Cancer Father        Skin  . Cancer Maternal Uncle     Social History   Socioeconomic History  . Marital status: Single    Spouse name: Not on file  . Number of children: 0  . Years of education: Not on file  . 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 Financial resource strain: Not on file  . Food insecurity    Worry: Not on file    Inability: Not on file  . Transportation needs    Medical: Not on file    Non-medical: Not on file  Tobacco Use  . Smoking status: Never Smoker  . Smokeless tobacco: Never Used  Substance and Sexual Activity  . Alcohol use: Yes    Alcohol/week: 0.0 - 2.0 standard drinks  . Drug use: No  . Sexual activity: Never    Partners: Male    Birth control/protection: Abstinence  Lifestyle  . Physical activity    Days per week: Not on file    Minutes per session: Not on file  . Stress: Not on file  Relationships  . Social Musician on phone: Not on file    Gets together: Not on file    Attends religious service: Not on file    Active member of club or organization: Not on file    Attends meetings of clubs or organizations: Not on file    Relationship status: Not on file  . Intimate partner violence    Fear of current or ex partner: Not on file    Emotionally abused: Not on file  Physically abused: Not on file    Forced sexual activity: Not on file  Other Topics Concern  . Not on file  Social History Narrative  . Not on file   Review of Systems  Constitutional: Negative for chills, fever and weight loss.  HENT: Negative for congestion, hearing loss and tinnitus.   Eyes: Negative for blurred vision and double vision.  Respiratory: Negative for cough, hemoptysis and wheezing.   Cardiovascular: Negative for chest pain and palpitations.  Gastrointestinal: Negative for abdominal pain, constipation, diarrhea, nausea and vomiting.  Genitourinary: Negative for dysuria, frequency and urgency.  Neurological: Positive for tingling. Negative for dizziness and headaches.  Psychiatric/Behavioral: Positive for depression. Negative for memory loss and suicidal ideas. The patient is nervous/anxious.    Resp 16   Ht 5\' 8"  (1.727 m)   Wt 252  lb (114.3 kg)   BMI 38.32 kg/m   Physical Exam Vitals signs reviewed.  Constitutional:      Appearance: Normal appearance.  HENT:     Head: Normocephalic and atraumatic.     Right Ear: Tympanic membrane, ear canal and external ear normal.     Left Ear: Tympanic membrane, ear canal and external ear normal.     Nose: Nose normal. No mucosal edema.     Mouth/Throat:     Mouth: Mucous membranes are moist.     Pharynx: Uvula midline. No oropharyngeal exudate or posterior oropharyngeal erythema.  Eyes:     Conjunctiva/sclera: Conjunctivae normal.     Pupils: Pupils are equal, round, and reactive to light.  Neck:     Musculoskeletal: Neck supple.     Thyroid: No thyromegaly.  Cardiovascular:     Rate and Rhythm: Normal rate and regular rhythm.     Pulses: Normal pulses.     Heart sounds: Normal heart sounds.  Pulmonary:     Effort: Pulmonary effort is normal. No respiratory distress.     Breath sounds: Normal breath sounds. No wheezing or rales.  Abdominal:     General: Bowel sounds are normal. There is no distension.     Palpations: Abdomen is soft. There is no mass.     Tenderness: There is no abdominal tenderness. There is no guarding or rebound.  Lymphadenopathy:     Cervical: No cervical adenopathy.  Skin:    General: Skin is warm and dry.     Findings: No rash.  Neurological:     Mental Status: She is alert and oriented to person, place, and time.     Cranial Nerves: No cranial nerve deficit.     Assessment/Plan: 1. Visit for preventive health examination Depression screen negative. Health Maintenance reviewed. Preventive schedule discussed and handout given in AVS. Will obtain fasting labs today.  - CBC with Differential/Platelet - Comprehensive metabolic panel - Hemoglobin A1c - Lipid panel  2. Other depression Controlled well with Citalopram 40 mg daily. Concern this is related to weight gain but will check other potential causes first. Discuss potential  wean on dose versus switch to Wellbutrin if labs unremarkable for other potential cause  3. Weight gain Labs today to include full thyroid panel. Again concern medications are contributing and may need to alter them. Is doing very well with diet and exercise. She is encouraged to continue this.  - TSH - T3, free - T4, free   Leeanne Rio, [place]

## 2018-11-18 NOTE — Patient Instructions (Signed)
Please go to the lab for blood work.   Our office will call you with your results unless you have chosen to receive results via MyChart.  If your blood work is normal we will follow-up each year for physicals and as scheduled for chronic medical problems.  If anything is abnormal we will treat accordingly and get you in for a follow-up.   Preventive Care 21-28 Years Old, Female Preventive care refers to visits with your health care provider and lifestyle choices that can promote health and wellness. This includes:  A yearly physical exam. This may also be called an annual well check.  Regular dental visits and eye exams.  Immunizations.  Screening for certain conditions.  Healthy lifestyle choices, such as eating a healthy diet, getting regular exercise, not using drugs or products that contain nicotine and tobacco, and limiting alcohol use. What can I expect for my preventive care visit? Physical exam Your health care provider will check your:  Height and weight. This may be used to calculate body mass index (BMI), which tells if you are at a healthy weight.  Heart rate and blood pressure.  Skin for abnormal spots. Counseling Your health care provider may ask you questions about your:  Alcohol, tobacco, and drug use.  Emotional well-being.  Home and relationship well-being.  Sexual activity.  Eating habits.  Work and work environment.  Method of birth control.  Menstrual cycle.  Pregnancy history. What immunizations do I need?  Influenza (flu) vaccine  This is recommended every year. Tetanus, diphtheria, and pertussis (Tdap) vaccine  You may need a Td booster every 10 years. Varicella (chickenpox) vaccine  You may need this if you have not been vaccinated. Human papillomavirus (HPV) vaccine  If recommended by your health care provider, you may need three doses over 6 months. Measles, mumps, and rubella (MMR) vaccine  You may need at least one dose of  MMR. You may also need a second dose. Meningococcal conjugate (MenACWY) vaccine  One dose is recommended if you are age 19-21 years and a first-year college student living in a residence hall, or if you have one of several medical conditions. You may also need additional booster doses. Pneumococcal conjugate (PCV13) vaccine  You may need this if you have certain conditions and were not previously vaccinated. Pneumococcal polysaccharide (PPSV23) vaccine  You may need one or two doses if you smoke cigarettes or if you have certain conditions. Hepatitis A vaccine  You may need this if you have certain conditions or if you travel or work in places where you may be exposed to hepatitis A. Hepatitis B vaccine  You may need this if you have certain conditions or if you travel or work in places where you may be exposed to hepatitis B. Haemophilus influenzae type b (Hib) vaccine  You may need this if you have certain conditions. You may receive vaccines as individual doses or as more than one vaccine together in one shot (combination vaccines). Talk with your health care provider about the risks and benefits of combination vaccines. What tests do I need?  Blood tests  Lipid and cholesterol levels. These may be checked every 5 years starting at age 20.  Hepatitis C test.  Hepatitis B test. Screening  Diabetes screening. This is done by checking your blood sugar (glucose) after you have not eaten for a while (fasting).  Sexually transmitted disease (STD) testing.  BRCA-related cancer screening. This may be done if you have a family history of   breast, ovarian, tubal, or peritoneal cancers.  Pelvic exam and Pap test. This may be done every 3 years starting at age 21. Starting at age 30, this may be done every 5 years if you have a Pap test in combination with an HPV test. Talk with your health care provider about your test results, treatment options, and if necessary, the need for more  tests. Follow these instructions at home: Eating and drinking   Eat a diet that includes fresh fruits and vegetables, whole grains, lean protein, and low-fat dairy.  Take vitamin and mineral supplements as recommended by your health care provider.  Do not drink alcohol if: ? Your health care provider tells you not to drink. ? You are pregnant, may be pregnant, or are planning to become pregnant.  If you drink alcohol: ? Limit how much you have to 0-1 drink a day. ? Be aware of how much alcohol is in your drink. In the [place] one drink equals one 12 oz bottle of beer (355 mL), one 5 oz glass of wine (148 mL), or one 1 oz glass of hard liquor (44 mL). Lifestyle  Take daily care of your teeth and gums.  Stay active. Exercise for at least 30 minutes on 5 or more days each week.  Do not use any products that contain nicotine or tobacco, such as cigarettes, e-cigarettes, and chewing tobacco. If you need help quitting, ask your health care provider.  If you are sexually active, practice safe sex. Use a condom or other form of birth control (contraception) in order to prevent pregnancy and STIs (sexually transmitted infections). If you plan to become pregnant, see your health care provider for a preconception visit. What's next?  Visit your health care provider once a year for a well check visit.  Ask your health care provider how often you should have your eyes and teeth checked.  Stay up to date on all vaccines. This information is not intended to replace advice given to you by your health care provider. Make sure you discuss any questions you have with your health care provider. Document Released: 03/10/2001 Document Revised: 09/23/2017 Document Reviewed: 09/23/2017 Elsevier Patient Education  2020 Elsevier Inc. .      

## 2018-11-19 LAB — CBC WITH DIFFERENTIAL/PLATELET
Absolute Monocytes: 780 cells/uL (ref 200–950)
Basophils Absolute: 40 cells/uL (ref 0–200)
Basophils Relative: 0.4 %
Eosinophils Absolute: 160 cells/uL (ref 15–500)
Eosinophils Relative: 1.6 %
HCT: 37.5 % (ref 35.0–45.0)
Hemoglobin: 12.2 g/dL (ref 11.7–15.5)
Lymphs Abs: 3130 cells/uL (ref 850–3900)
MCH: 26.9 pg — ABNORMAL LOW (ref 27.0–33.0)
MCHC: 32.5 g/dL (ref 32.0–36.0)
MCV: 82.6 fL (ref 80.0–100.0)
MPV: 12.2 fL (ref 7.5–12.5)
Monocytes Relative: 7.8 %
Neutro Abs: 5890 cells/uL (ref 1500–7800)
Neutrophils Relative %: 58.9 %
Platelets: 267 10*3/uL (ref 140–400)
RBC: 4.54 10*6/uL (ref 3.80–5.10)
RDW: 13.7 % (ref 11.0–15.0)
Total Lymphocyte: 31.3 %
WBC: 10 10*3/uL (ref 3.8–10.8)

## 2018-11-19 LAB — TSH: TSH: 2.31 mIU/L

## 2018-11-19 LAB — COMPREHENSIVE METABOLIC PANEL
AG Ratio: 1.7 (calc) (ref 1.0–2.5)
ALT: 14 U/L (ref 6–29)
AST: 18 U/L (ref 10–30)
Albumin: 4.2 g/dL (ref 3.6–5.1)
Alkaline phosphatase (APISO): 72 U/L (ref 31–125)
BUN: 16 mg/dL (ref 7–25)
CO2: 24 mmol/L (ref 20–32)
Calcium: 9 mg/dL (ref 8.6–10.2)
Chloride: 104 mmol/L (ref 98–110)
Creat: 0.75 mg/dL (ref 0.50–1.10)
Globulin: 2.5 g/dL (calc) (ref 1.9–3.7)
Glucose, Bld: 90 mg/dL (ref 65–99)
Potassium: 4.4 mmol/L (ref 3.5–5.3)
Sodium: 137 mmol/L (ref 135–146)
Total Bilirubin: 0.2 mg/dL (ref 0.2–1.2)
Total Protein: 6.7 g/dL (ref 6.1–8.1)

## 2018-11-19 LAB — T3, FREE: T3, Free: 3.3 pg/mL (ref 2.3–4.2)

## 2018-11-19 LAB — LIPID PANEL
Cholesterol: 175 mg/dL (ref <200)
HDL: 55 mg/dL (ref >=50)
LDL Cholesterol (Calc): 104 mg/dL (calc) — ABNORMAL HIGH
Non-HDL Cholesterol (Calc): 120 mg/dL (calc) (ref <130)
Total CHOL/HDL Ratio: 3.2 (calc) (ref <5.0)
Triglycerides: 75 mg/dL (ref <150)

## 2018-11-19 LAB — HEMOGLOBIN A1C
Hgb A1c MFr Bld: 4.9 % of total Hgb (ref <5.7)
Mean Plasma Glucose: 94 (calc)
eAG (mmol/L): 5.2 (calc)

## 2018-11-19 LAB — T4, FREE: Free T4: 1.1 ng/dL (ref 0.8–1.8)

## 2018-11-22 ENCOUNTER — Other Ambulatory Visit: Payer: Self-pay | Admitting: Emergency Medicine

## 2018-11-22 DIAGNOSIS — R635 Abnormal weight gain: Secondary | ICD-10-CM

## 2018-11-22 DIAGNOSIS — F332 Major depressive disorder, recurrent severe without psychotic features: Secondary | ICD-10-CM | POA: Diagnosis not present

## 2018-11-22 DIAGNOSIS — F3289 Other specified depressive episodes: Secondary | ICD-10-CM

## 2018-11-22 MED ORDER — BUPROPION HCL ER (XL) 150 MG PO TB24: 150.0000 mg | ORAL_TABLET | Freq: Every day | ORAL | 1 refills | Status: DC

## 2018-11-22 MED ORDER — CITALOPRAM HYDROBROMIDE 20 MG PO TABS: ORAL_TABLET | ORAL | 0 refills | Status: DC

## 2018-11-29 ENCOUNTER — Encounter: Payer: Self-pay | Admitting: Physician Assistant

## 2018-11-29 DIAGNOSIS — E669 Obesity, unspecified: Secondary | ICD-10-CM | POA: Insufficient documentation

## 2018-12-02 DIAGNOSIS — F332 Major depressive disorder, recurrent severe without psychotic features: Secondary | ICD-10-CM | POA: Diagnosis not present

## 2018-12-06 DIAGNOSIS — M9901 Segmental and somatic dysfunction of cervical region: Secondary | ICD-10-CM | POA: Diagnosis not present

## 2018-12-14 ENCOUNTER — Other Ambulatory Visit: Payer: Self-pay | Admitting: Physician Assistant

## 2018-12-14 DIAGNOSIS — F3289 Other specified depressive episodes: Secondary | ICD-10-CM

## 2018-12-14 DIAGNOSIS — F332 Major depressive disorder, recurrent severe without psychotic features: Secondary | ICD-10-CM | POA: Diagnosis not present

## 2018-12-15 ENCOUNTER — Other Ambulatory Visit: Payer: Self-pay

## 2018-12-15 DIAGNOSIS — F3289 Other specified depressive episodes: Secondary | ICD-10-CM

## 2018-12-15 MED ORDER — BUPROPION HCL ER (XL) 150 MG PO TB24: 150.0000 mg | ORAL_TABLET | Freq: Every day | ORAL | 0 refills | Status: DC

## 2018-12-15 NOTE — Telephone Encounter (Signed)
Left message on VM advising if patient stopped the Citalopram and fully taking the Wellbutrin. Patient does have a follow up appointment on 12/20/18

## 2018-12-15 NOTE — Telephone Encounter (Signed)
Patient called in stating that she is just taking the Wellbutrin, script sent to [facility]

## 2018-12-19 DIAGNOSIS — F332 Major depressive disorder, recurrent severe without psychotic features: Secondary | ICD-10-CM | POA: Diagnosis not present

## 2018-12-20 ENCOUNTER — Encounter: Payer: Self-pay | Admitting: Physician Assistant

## 2018-12-20 ENCOUNTER — Other Ambulatory Visit: Payer: Self-pay

## 2018-12-20 ENCOUNTER — Ambulatory Visit (INDEPENDENT_AMBULATORY_CARE_PROVIDER_SITE_OTHER): Payer: BLUE CROSS/BLUE SHIELD | Admitting: Physician Assistant

## 2018-12-20 VITALS — BP 100/78 | HR 59 | Temp 97.3°F | Resp 16 | Ht 68.0 in | Wt 253.0 lb

## 2018-12-20 DIAGNOSIS — R635 Abnormal weight gain: Secondary | ICD-10-CM | POA: Diagnosis not present

## 2018-12-20 DIAGNOSIS — F3289 Other specified depressive episodes: Secondary | ICD-10-CM

## 2018-12-20 NOTE — Progress Notes (Signed)
Patient presents to clinic today for follow-up of depression. At last visit a decision was made to switch her from Citalopram (Subtheapeutic and noted weight gain) to Wellbutrin XL, using a cross taper. Followed instructions given. Noted doing well initially but after fulling weaning off Citalopram noted some lightheadedness and jitteriness for a few days that has now completely resolved. Is still taking the Wellbutrin as directed. Tolerating well now with some mild improvement in mood. Denies SI/HI.  Past Medical History:  Diagnosis Date   Anemia    Iron Deficiency -- Diagnosed when she was a teenager.   Depression    Diagnosed Fall 2016.    Current Outpatient Medications on File Prior to Visit  Medication Sig Dispense Refill   buPROPion (WELLBUTRIN XL) 150 MG 24 hr tablet Take 1 tablet (150 mg total) by mouth daily. 90 tablet 0   Elderberry 575 MG/5ML SYRP Take 1 mL by mouth daily as needed.     Melatonin 10 MG TABS Take 1 tablet by mouth at bedtime.     No current facility-administered medications on file prior to visit.     No Known Allergies  Family History  Problem Relation Age of Onset   Cancer Maternal Grandfather        lung cancer   Cancer Paternal Grandmother        skin cancer   Diabetes Paternal Grandfather    Hepatitis C Mother        On treatment   Cancer Father        Skin   Cancer Maternal Uncle     Social History   Socioeconomic History   Marital status: Single    Spouse name: Not on file   Number of children: 0   Years of education: Not on file   Highest education level: Not on file  Occupational History    Comment: Toxicology with [employer]  Social Needs   Financial resource strain: Not on file   Food insecurity    Worry: Not on file    Inability: Not on file   Transportation needs    Medical: Not on file    Non-medical: Not on file  Tobacco Use   Smoking status: Never Smoker   Smokeless tobacco: Never  Used  Substance and Sexual Activity   Alcohol use: Yes    Alcohol/week: 0.0 - 2.0 standard drinks   Drug use: No   Sexual activity: Never    Partners: Male    Birth control/protection: Abstinence  Lifestyle   Physical activity    Days per week: Not on file    Minutes per session: Not on file   Stress: Not on file  Relationships   Social connections    Talks on phone: Not on file    Gets together: Not on file    Attends religious service: Not on file    Active member of club or organization: Not on file    Attends meetings of clubs or organizations: Not on file    Relationship status: Not on file  Other Topics Concern   Not on file  Social History Narrative   Not on file   Review of Systems - See HPI.  All other ROS are negative.  BP 100/78    Pulse (!) 59    Temp (!) 97.3 F (36.3 C) (Temporal)    Resp 16    Ht 5\' 8"  (1.727 m)    Wt 253 lb (114.8 kg)  SpO2 99%    BMI 38.47 kg/m   Physical Exam Vitals signs reviewed.  Constitutional:      Appearance: Normal appearance.  HENT:     Head: Normocephalic and atraumatic.  Neck:     Musculoskeletal: Neck supple.  Pulmonary:     Effort: Pulmonary effort is normal.  Neurological:     General: No focal deficit present.     Mental Status: She is alert.  Psychiatric:        Mood and Affect: Mood normal.    Recent Results (from the past 2160 hour(s))  CBC with Differential/Platelet     Status: Abnormal   Collection Time: 11/18/18  3:00 PM  Result Value Ref Range   WBC 10.0 3.8 - 10.8 Thousand/uL   RBC 4.54 3.80 - 5.10 Million/uL   Hemoglobin 12.2 11.7 - 15.5 g/dL   HCT 37.5 35.0 - 45.0 %   MCV 82.6 80.0 - 100.0 fL   MCH 26.9 (L) 27.0 - 33.0 pg   MCHC 32.5 32.0 - 36.0 g/dL   RDW 13.7 11.0 - 15.0 %   Platelets 267 140 - 400 Thousand/uL   MPV 12.2 7.5 - 12.5 fL   Neutro Abs 5,890 1,500 - 7,800 cells/uL   Lymphs Abs 3,130 850 - 3,900 cells/uL   Absolute Monocytes 780 200 - 950 cells/uL   Eosinophils Absolute  160 15 - 500 cells/uL   Basophils Absolute 40 0 - 200 cells/uL   Neutrophils Relative % 58.9 %   Total Lymphocyte 31.3 %   Monocytes Relative 7.8 %   Eosinophils Relative 1.6 %   Basophils Relative 0.4 %  Comprehensive metabolic panel     Status: None   Collection Time: 11/18/18  3:00 PM  Result Value Ref Range   Glucose, Bld 90 65 - 99 mg/dL    Comment: .            Fasting reference interval .    BUN 16 7 - 25 mg/dL   Creat 0.75 0.50 - 1.10 mg/dL   BUN/Creatinine Ratio NOT APPLICABLE 6 - 22 (calc)   Sodium 137 135 - 146 mmol/L   Potassium 4.4 3.5 - 5.3 mmol/L   Chloride 104 98 - 110 mmol/L   CO2 24 20 - 32 mmol/L   Calcium 9.0 8.6 - 10.2 mg/dL   Total Protein 6.7 6.1 - 8.1 g/dL   Albumin 4.2 3.6 - 5.1 g/dL   Globulin 2.5 1.9 - 3.7 g/dL (calc)   AG Ratio 1.7 1.0 - 2.5 (calc)   Total Bilirubin 0.2 0.2 - 1.2 mg/dL   Alkaline phosphatase (APISO) 72 31 - 125 U/L   AST 18 10 - 30 U/L   ALT 14 6 - 29 U/L  Hemoglobin A1c     Status: None   Collection Time: 11/18/18  3:00 PM  Result Value Ref Range   Hgb A1c MFr Bld 4.9 <5.7 % of total Hgb    Comment: For the purpose of screening for the presence of diabetes: . <5.7%       Consistent with the absence of diabetes 5.7-6.4%    Consistent with increased risk for diabetes             (prediabetes) > or =6.5%  Consistent with diabetes . This assay result is consistent with a decreased risk of diabetes. . Currently, no consensus exists regarding use of hemoglobin A1c for diagnosis of diabetes in children. . According to American Diabetes Association (ADA) guidelines, hemoglobin A1c <7.0%  represents optimal control in non-pregnant diabetic patients. Different metrics may apply to specific patient populations.  Standards of Medical Care in Diabetes(ADA). .    Mean Plasma Glucose 94 (calc)   eAG (mmol/L) 5.2 (calc)  Lipid panel     Status: Abnormal   Collection Time: 11/18/18  3:00 PM  Result Value Ref Range   Cholesterol  175 <200 mg/dL   HDL 55 > OR = 50 mg/dL   Triglycerides 75 <161<150 mg/dL   LDL Cholesterol (Calc) 104 (H) mg/dL (calc)    Comment: Reference range: <100 . Desirable range <100 mg/dL for primary prevention;   <70 mg/dL for patients with CHD or diabetic patients  with > or = 2 CHD risk factors. Marland Kitchen. LDL-C is now calculated using the Gal Smolinski-Hopkins  calculation, which is a validated novel method providing  better accuracy than the Friedewald equation in the  estimation of LDL-C.  Horald PollenMartin SS et al. Lenox AhrJAMA. 0960;454(092013;310(19): 2061-2068  (http://education.[facility].com/faq/FAQ164)    Total CHOL/HDL Ratio 3.2 <5.0 (calc)   Non-HDL Cholesterol (Calc) 120 <130 mg/dL (calc)    Comment: For patients with diabetes plus 1 major ASCVD risk  factor, treating to a non-HDL-C goal of <100 mg/dL  (LDL-C of <81<70 mg/dL) is considered a therapeutic  option.   TSH     Status: None   Collection Time: 11/18/18  3:00 PM  Result Value Ref Range   TSH 2.31 mIU/L    Comment:           Reference Range .           > or = 20 Years  0.40-4.50 .                Pregnancy Ranges           First trimester    0.26-2.66           Second trimester   0.55-2.73           Third trimester    0.43-2.91   T3, free     Status: None   Collection Time: 11/18/18  3:00 PM  Result Value Ref Range   T3, Free 3.3 2.3 - 4.2 pg/mL  T4, free     Status: None   Collection Time: 11/18/18  3:00 PM  Result Value Ref Range   Free T4 1.1 0.8 - 1.8 ng/dL    Assessment/Plan: 1. Other depression Had some issue coming off Citalopram even with cross-taper. These have resolved. Mood improving now. Would like to keep on current dose of Wellbutrin XL for another 2 weeks to see if things continue to improve. She will follow-up via MyChart at that time to let us know how things are going. Will increase medication at that time if indicated. Continue weekly counseling.     Piedad ClimesWilliam Cody Pablo Mathurin, PA-C

## 2018-12-20 NOTE — Patient Instructions (Signed)
Please continue the Wellbutrin XL at current dose.   If symptoms are not continuing to improve over the next 2 weeks we should consider increasing the dose further.   Please message me on MyChart or give [place] a call to let us know how you are doing.   If going well we will follow-up in 6 months.   I will keep an eye out for further notes from your specialist.

## 2018-12-30 DIAGNOSIS — F332 Major depressive disorder, recurrent severe without psychotic features: Secondary | ICD-10-CM | POA: Diagnosis not present

## 2019-01-06 DIAGNOSIS — F332 Major depressive disorder, recurrent severe without psychotic features: Secondary | ICD-10-CM | POA: Diagnosis not present

## 2019-01-14 ENCOUNTER — Other Ambulatory Visit: Payer: Self-pay | Admitting: Physician Assistant

## 2019-01-14 DIAGNOSIS — F3289 Other specified depressive episodes: Secondary | ICD-10-CM

## 2019-01-16 ENCOUNTER — Encounter: Payer: Self-pay | Admitting: Emergency Medicine

## 2019-01-16 NOTE — Telephone Encounter (Signed)
My chart message sent to patient about how Wellbutrin is working. Citalopram weaned down last visit

## 2019-01-17 DIAGNOSIS — F332 Major depressive disorder, recurrent severe without psychotic features: Secondary | ICD-10-CM | POA: Diagnosis not present

## 2019-01-24 ENCOUNTER — Encounter: Payer: Self-pay | Admitting: Physician Assistant

## 2019-01-24 ENCOUNTER — Ambulatory Visit: Payer: BLUE CROSS/BLUE SHIELD | Attending: Internal Medicine

## 2019-01-24 DIAGNOSIS — Z20828 Contact with and (suspected) exposure to other viral communicable diseases: Secondary | ICD-10-CM | POA: Diagnosis not present

## 2019-01-24 DIAGNOSIS — Z20822 Contact with and (suspected) exposure to covid-19: Secondary | ICD-10-CM

## 2019-01-25 LAB — NOVEL CORONAVIRUS, NAA: SARS-CoV-2, NAA: NOT DETECTED

## 2019-01-31 MED ORDER — BUPROPION HCL ER (XL) 300 MG PO TB24: 300.0000 mg | ORAL_TABLET | Freq: Every day | ORAL | 1 refills | Status: DC

## 2019-01-31 NOTE — Addendum Note (Signed)
Addended by: Waldon Merl on: 01/31/2019 02:29 PM   Modules accepted: Orders

## 2019-02-03 DIAGNOSIS — F332 Major depressive disorder, recurrent severe without psychotic features: Secondary | ICD-10-CM | POA: Diagnosis not present

## 2019-02-09 DIAGNOSIS — F332 Major depressive disorder, recurrent severe without psychotic features: Secondary | ICD-10-CM | POA: Diagnosis not present

## 2019-02-23 DIAGNOSIS — F332 Major depressive disorder, recurrent severe without psychotic features: Secondary | ICD-10-CM | POA: Diagnosis not present

## 2019-03-01 DIAGNOSIS — F332 Major depressive disorder, recurrent severe without psychotic features: Secondary | ICD-10-CM | POA: Diagnosis not present

## 2019-03-09 DIAGNOSIS — M9901 Segmental and somatic dysfunction of cervical region: Secondary | ICD-10-CM | POA: Diagnosis not present

## 2019-03-14 DIAGNOSIS — R6884 Jaw pain: Secondary | ICD-10-CM | POA: Diagnosis not present

## 2019-03-16 ENCOUNTER — Other Ambulatory Visit: Payer: Self-pay | Admitting: Physician Assistant

## 2019-03-16 ENCOUNTER — Encounter: Payer: Self-pay | Admitting: Emergency Medicine

## 2019-03-16 DIAGNOSIS — F3289 Other specified depressive episodes: Secondary | ICD-10-CM

## 2019-03-17 DIAGNOSIS — R6884 Jaw pain: Secondary | ICD-10-CM | POA: Diagnosis not present

## 2019-03-20 DIAGNOSIS — F332 Major depressive disorder, recurrent severe without psychotic features: Secondary | ICD-10-CM | POA: Diagnosis not present

## 2019-03-20 DIAGNOSIS — R6884 Jaw pain: Secondary | ICD-10-CM | POA: Diagnosis not present

## 2019-03-22 DIAGNOSIS — R6884 Jaw pain: Secondary | ICD-10-CM | POA: Diagnosis not present

## 2019-03-28 DIAGNOSIS — R6884 Jaw pain: Secondary | ICD-10-CM | POA: Diagnosis not present

## 2019-03-30 DIAGNOSIS — R6884 Jaw pain: Secondary | ICD-10-CM | POA: Diagnosis not present

## 2019-04-04 DIAGNOSIS — R6884 Jaw pain: Secondary | ICD-10-CM | POA: Diagnosis not present

## 2019-04-05 DIAGNOSIS — F332 Major depressive disorder, recurrent severe without psychotic features: Secondary | ICD-10-CM | POA: Diagnosis not present

## 2019-04-06 DIAGNOSIS — R6884 Jaw pain: Secondary | ICD-10-CM | POA: Diagnosis not present

## 2019-04-10 ENCOUNTER — Encounter: Payer: Self-pay | Admitting: Certified Nurse Midwife

## 2019-04-12 ENCOUNTER — Encounter: Payer: Self-pay | Admitting: Certified Nurse Midwife

## 2019-04-12 DIAGNOSIS — R6884 Jaw pain: Secondary | ICD-10-CM | POA: Diagnosis not present

## 2019-04-13 DIAGNOSIS — F332 Major depressive disorder, recurrent severe without psychotic features: Secondary | ICD-10-CM | POA: Diagnosis not present

## 2019-04-14 DIAGNOSIS — R6884 Jaw pain: Secondary | ICD-10-CM | POA: Diagnosis not present

## 2019-04-19 DIAGNOSIS — R6884 Jaw pain: Secondary | ICD-10-CM | POA: Diagnosis not present

## 2019-04-20 DIAGNOSIS — F332 Major depressive disorder, recurrent severe without psychotic features: Secondary | ICD-10-CM | POA: Diagnosis not present

## 2019-04-21 DIAGNOSIS — R6884 Jaw pain: Secondary | ICD-10-CM | POA: Diagnosis not present

## 2019-04-26 DIAGNOSIS — R6884 Jaw pain: Secondary | ICD-10-CM | POA: Diagnosis not present

## 2019-04-28 DIAGNOSIS — R6884 Jaw pain: Secondary | ICD-10-CM | POA: Diagnosis not present

## 2019-05-02 DIAGNOSIS — R6884 Jaw pain: Secondary | ICD-10-CM | POA: Diagnosis not present

## 2019-05-04 DIAGNOSIS — R6884 Jaw pain: Secondary | ICD-10-CM | POA: Diagnosis not present

## 2019-05-11 DIAGNOSIS — F332 Major depressive disorder, recurrent severe without psychotic features: Secondary | ICD-10-CM | POA: Diagnosis not present

## 2019-05-19 DIAGNOSIS — F332 Major depressive disorder, recurrent severe without psychotic features: Secondary | ICD-10-CM | POA: Diagnosis not present

## 2019-05-25 DIAGNOSIS — F332 Major depressive disorder, recurrent severe without psychotic features: Secondary | ICD-10-CM | POA: Diagnosis not present

## 2019-06-23 DIAGNOSIS — F3281 Premenstrual dysphoric disorder: Secondary | ICD-10-CM | POA: Diagnosis not present

## 2019-06-23 DIAGNOSIS — F3341 Major depressive disorder, recurrent, in partial remission: Secondary | ICD-10-CM | POA: Diagnosis not present

## 2019-07-13 DIAGNOSIS — F3281 Premenstrual dysphoric disorder: Secondary | ICD-10-CM | POA: Diagnosis not present

## 2019-07-13 DIAGNOSIS — F3341 Major depressive disorder, recurrent, in partial remission: Secondary | ICD-10-CM | POA: Diagnosis not present

## 2019-08-10 DIAGNOSIS — F3281 Premenstrual dysphoric disorder: Secondary | ICD-10-CM | POA: Diagnosis not present

## 2019-08-10 DIAGNOSIS — F3341 Major depressive disorder, recurrent, in partial remission: Secondary | ICD-10-CM | POA: Diagnosis not present

## 2019-08-14 ENCOUNTER — Other Ambulatory Visit: Payer: Self-pay | Admitting: Physician Assistant

## 2019-08-17 ENCOUNTER — Ambulatory Visit: Payer: BLUE CROSS/BLUE SHIELD | Admitting: Physician Assistant

## 2019-08-21 ENCOUNTER — Encounter: Payer: Self-pay | Admitting: Physician Assistant

## 2019-08-21 ENCOUNTER — Other Ambulatory Visit: Payer: Self-pay

## 2019-08-21 ENCOUNTER — Ambulatory Visit (INDEPENDENT_AMBULATORY_CARE_PROVIDER_SITE_OTHER): Payer: BLUE CROSS/BLUE SHIELD | Admitting: Physician Assistant

## 2019-08-21 ENCOUNTER — Ambulatory Visit: Payer: BLUE CROSS/BLUE SHIELD | Admitting: Physician Assistant

## 2019-08-21 VITALS — BP 100/60 | HR 79 | Temp 98.1°F | Resp 16 | Ht 68.0 in | Wt 251.0 lb

## 2019-08-21 DIAGNOSIS — R42 Dizziness and giddiness: Secondary | ICD-10-CM

## 2019-08-21 DIAGNOSIS — M9901 Segmental and somatic dysfunction of cervical region: Secondary | ICD-10-CM | POA: Diagnosis not present

## 2019-08-21 DIAGNOSIS — F3289 Other specified depressive episodes: Secondary | ICD-10-CM | POA: Diagnosis not present

## 2019-08-21 LAB — CBC WITH DIFFERENTIAL/PLATELET
Basophils Absolute: 0.1 K/uL (ref 0.0–0.1)
Basophils Relative: 0.8 % (ref 0.0–3.0)
Eosinophils Absolute: 0.1 K/uL (ref 0.0–0.7)
Eosinophils Relative: 0.7 % (ref 0.0–5.0)
HCT: 42 % (ref 36.0–46.0)
Hemoglobin: 13.9 g/dL (ref 12.0–15.0)
Lymphocytes Relative: 26.1 % (ref 12.0–46.0)
Lymphs Abs: 2.4 K/uL (ref 0.7–4.0)
MCHC: 33 g/dL (ref 30.0–36.0)
MCV: 86.2 fl (ref 78.0–100.0)
Monocytes Absolute: 0.6 K/uL (ref 0.1–1.0)
Monocytes Relative: 6.2 % (ref 3.0–12.0)
Neutro Abs: 6.1 K/uL (ref 1.4–7.7)
Neutrophils Relative %: 66.2 % (ref 43.0–77.0)
Platelets: 277 K/uL (ref 150.0–400.0)
RBC: 4.88 Mil/uL (ref 3.87–5.11)
RDW: 14.1 % (ref 11.5–15.5)
WBC: 9.3 K/uL (ref 4.0–10.5)

## 2019-08-21 LAB — COMPREHENSIVE METABOLIC PANEL WITH GFR
ALT: 15 U/L (ref 0–35)
AST: 18 U/L (ref 0–37)
Albumin: 4.6 g/dL (ref 3.5–5.2)
Alkaline Phosphatase: 72 U/L (ref 39–117)
BUN: 9 mg/dL (ref 6–23)
CO2: 27 meq/L (ref 19–32)
Calcium: 9.6 mg/dL (ref 8.4–10.5)
Chloride: 104 meq/L (ref 96–112)
Creatinine, Ser: 1.05 mg/dL (ref 0.40–1.20)
GFR: 62.05 mL/min
Glucose, Bld: 94 mg/dL (ref 70–99)
Potassium: 4.8 meq/L (ref 3.5–5.1)
Sodium: 137 meq/L (ref 135–145)
Total Bilirubin: 0.4 mg/dL (ref 0.2–1.2)
Total Protein: 7.2 g/dL (ref 6.0–8.3)

## 2019-08-21 LAB — TSH: TSH: 1.34 u[IU]/mL (ref 0.35–4.50)

## 2019-08-21 NOTE — Progress Notes (Signed)
Patient presents to clinic today for follow-up of depression. Patient is currently on a regimen of Wellbutrin Xl 300 mg daily. Endorses taking medication daily as directed and tolerating well. Notes overall helping with her depression. Notes some increase in symptoms over the past few weeks which she feels is secondary to menstrual cycle after discussing with her counselor. Overall feels medication has worked well for her.  Has noted over the past few weeks having episodes of lightheadedness during her high-intensity interval training. Notes mainly when squatting with weights and then standing. Denies syncope. Denies dizziness, racing heart, shortness of breath or chest discomfort. Denies era pressure or popping. Has been keeping well-hydrated and trying to follow a good diet. Asymptomatic at present.   Past Medical History:  Diagnosis Date  . Anemia    Iron Deficiency -- Diagnosed when she was a teenager.  . Depression    Diagnosed Fall 2016.    Current Outpatient Medications on File Prior to Visit  Medication Sig Dispense Refill  . buPROPion (WELLBUTRIN XL) 300 MG 24 hr tablet TAKE 1 TABLET BY MOUTH EVERY DAY 30 tablet 0  . Cyanocobalamin (B12 LIQUID HEALTH BOOSTER PO)     . Melatonin 10 MG TABS Take 1 tablet by mouth at bedtime.     No current facility-administered medications on file prior to visit.    No Known Allergies  Family History  Problem Relation Age of Onset  . Cancer Maternal Grandfather        lung cancer  . Cancer Paternal Grandmother        skin cancer  . Diabetes Paternal Grandfather   . Hepatitis C Mother        On treatment  . Cancer Father        Skin  . Cancer Maternal Uncle     Social History   Socioeconomic History  . Marital status: Single    Spouse name: Not on file  . Number of children: 0  . Years of education: Not on file  . Highest education level: Not on file  Occupational History    Comment: Toxicology with [employer]    Tobacco Use  . Smoking status: Never Smoker  . Smokeless tobacco: Never Used  Vaping Use  . Vaping Use: Never used  Substance and Sexual Activity  . Alcohol use: Yes    Alcohol/week: 0.0 - 2.0 standard drinks  . Drug use: No  . Sexual activity: Never    Partners: Male    Birth control/protection: Abstinence  Other Topics Concern  . Not on file  Social History Narrative  . Not on file   Social Determinants of Health   Financial Resource Strain:   . Difficulty of Paying Living Expenses:   Food Insecurity:   . Worried About Programme researcher, broadcasting/film/video in the Last Year:   . Barista in the Last Year:   Transportation Needs:   . Freight forwarder (Medical):   Marland Kitchen Lack of Transportation (Non-Medical):   Physical Activity:   . Days of Exercise per Week:   . Minutes of Exercise per Session:   Stress:   . Feeling of Stress :   Social Connections:   . Frequency of Communication with Friends and Family:   . Frequency of Social Gatherings with Friends and Family:   . Attends Religious Services:   . Active Member of Clubs or Organizations:   . Attends Banker Meetings:   Marland Kitchen Marital Status:  Review of Systems - See HPI.  All other ROS are negative.  Resp 16   Ht 5\' 8"  (1.727 m)   Wt (!) 251 lb (113.9 kg)   BMI 38.16 kg/m   Physical Exam Vitals reviewed.  Constitutional:      Appearance: Normal appearance.  HENT:     Head: Normocephalic and atraumatic.  Eyes:     Conjunctiva/sclera: Conjunctivae normal.     Pupils: Pupils are equal, round, and reactive to light.  Cardiovascular:     Rate and Rhythm: Normal rate and regular rhythm.     Pulses: Normal pulses.     Heart sounds: Normal heart sounds.  Pulmonary:     Effort: Pulmonary effort is normal.     Breath sounds: Normal breath sounds.  Musculoskeletal:     Cervical back: Neck supple.  Neurological:     General: No focal deficit present.     Mental Status: She is alert and oriented to person,  place, and time.  Psychiatric:        Mood and Affect: Mood normal.    Assessment/Plan: 1. Other depression Doing well overall. Some hormonal-associated mood changes. She is working with her counselor on this. Does not wish to adjust medications at present. Will monitor.   2. Episodic lightheadedness Only during exercise when going from squatting to standing position. No other positional changes triggering. Exam unremarkable. OVS negative. Concern that she is not hydrating well before intense workouts. Proper pre-workout and post-workout intake reviewed. Labs today to further assess.  - CBC with Differential/Platelet - Comprehensive metabolic panel - TSH  This visit occurred during the SARS-CoV-2 public health emergency.  Safety protocols were in place, including screening questions prior to the visit, additional usage of staff PPE, and extensive cleaning of exam room while observing appropriate contact time as indicated for disinfecting solutions.     , PA-C

## 2019-08-21 NOTE — Patient Instructions (Signed)
Please go to the lab today for blood work.  I will call you with your results. We will alter treatment regimen(s) if indicated by your results.   Stay well-hydrated. Make sure to drink a small electrolyte water like Gatorade before heavy exercise to keep electrolytes stable and to keep BP stable.   You can consider compression stockings to wear during exercise to help prevent lightheadedness with position change. I am checking some things today to make sure nothing else is contributing.   Please continue chronic medications as directed.

## 2019-08-23 ENCOUNTER — Ambulatory Visit: Payer: BLUE CROSS/BLUE SHIELD | Attending: Internal Medicine

## 2019-08-23 DIAGNOSIS — Z20822 Contact with and (suspected) exposure to covid-19: Secondary | ICD-10-CM | POA: Diagnosis not present

## 2019-08-24 LAB — NOVEL CORONAVIRUS, NAA: SARS-CoV-2, NAA: NOT DETECTED

## 2019-08-24 LAB — SARS-COV-2, NAA 2 DAY TAT

## 2019-08-25 DIAGNOSIS — F3341 Major depressive disorder, recurrent, in partial remission: Secondary | ICD-10-CM | POA: Diagnosis not present

## 2019-08-25 DIAGNOSIS — F3281 Premenstrual dysphoric disorder: Secondary | ICD-10-CM | POA: Diagnosis not present

## 2019-08-30 DIAGNOSIS — L680 Hirsutism: Secondary | ICD-10-CM | POA: Diagnosis not present

## 2019-09-06 DIAGNOSIS — F3341 Major depressive disorder, recurrent, in partial remission: Secondary | ICD-10-CM | POA: Diagnosis not present

## 2019-09-06 DIAGNOSIS — F3281 Premenstrual dysphoric disorder: Secondary | ICD-10-CM | POA: Diagnosis not present

## 2019-09-07 DIAGNOSIS — Z03818 Encounter for observation for suspected exposure to other biological agents ruled out: Secondary | ICD-10-CM | POA: Diagnosis not present

## 2019-09-07 DIAGNOSIS — J019 Acute sinusitis, unspecified: Secondary | ICD-10-CM | POA: Diagnosis not present

## 2019-09-08 ENCOUNTER — Other Ambulatory Visit: Payer: Self-pay | Admitting: Physician Assistant

## 2019-09-13 DIAGNOSIS — F3341 Major depressive disorder, recurrent, in partial remission: Secondary | ICD-10-CM | POA: Diagnosis not present

## 2019-09-13 DIAGNOSIS — F3281 Premenstrual dysphoric disorder: Secondary | ICD-10-CM | POA: Diagnosis not present

## 2019-09-14 ENCOUNTER — Encounter: Payer: Self-pay | Admitting: Physician Assistant

## 2019-09-18 ENCOUNTER — Encounter: Payer: Self-pay | Admitting: Physician Assistant

## 2019-09-18 ENCOUNTER — Other Ambulatory Visit: Payer: Self-pay

## 2019-09-18 ENCOUNTER — Telehealth (INDEPENDENT_AMBULATORY_CARE_PROVIDER_SITE_OTHER): Payer: BLUE CROSS/BLUE SHIELD | Admitting: Physician Assistant

## 2019-09-18 DIAGNOSIS — J019 Acute sinusitis, unspecified: Secondary | ICD-10-CM | POA: Diagnosis not present

## 2019-09-18 DIAGNOSIS — J9801 Acute bronchospasm: Secondary | ICD-10-CM

## 2019-09-18 DIAGNOSIS — B9689 Other specified bacterial agents as the cause of diseases classified elsewhere: Secondary | ICD-10-CM

## 2019-09-18 MED ORDER — ALBUTEROL SULFATE HFA 108 (90 BASE) MCG/ACT IN AERS: 2.0000 | INHALATION_SPRAY | Freq: Four times a day (QID) | RESPIRATORY_TRACT | 0 refills | Status: DC | PRN

## 2019-09-18 MED ORDER — FLUCONAZOLE 150 MG PO TABS
150.0000 mg | ORAL_TABLET | Freq: Once | ORAL | 0 refills | Status: AC
Start: 2019-09-18 — End: 2019-09-18

## 2019-09-18 MED ORDER — TRIAMCINOLONE ACETONIDE 55 MCG/ACT NA AERO: 2.0000 | INHALATION_SPRAY | Freq: Every day | NASAL | 2 refills | Status: DC

## 2019-09-18 MED ORDER — DOXYCYCLINE HYCLATE 100 MG PO CAPS: 100.0000 mg | ORAL_CAPSULE | Freq: Two times a day (BID) | ORAL | 0 refills | Status: DC

## 2019-09-18 NOTE — Progress Notes (Signed)
   Virtual Visit via Video   I connected with patient on 09/18/19 at  9:00 AM EDT by a video enabled telemedicine application and verified that I am speaking with the correct person using two identifiers.  Location patient: Home Location provider: [place] [facility], [facility] Persons participating in the virtual visit: Patient, Provider, CMA (Patina Moore)  I discussed the limitations of evaluation and management by telemedicine and the availability of in person appointments. The patient expressed understanding and agreed to proceed.  Subjective:   HPI:   Patient presents via [facility] today c/o ongoing sinus symptoms. Patient endorses about 2.5 weeks of symptoms total.  Patient endorses initially having nasal congestion, sinus pressure, sinus pain, fatigue and aches.  About 1 week into her symptoms was seen at an urgent care.  Had Covid testing which was negative.  Was diagnosed with acute bacterial sinusitis and started on a course of amoxicillin x7 days.  Patient endorses taking medications as directed and tolerating very well overall.  Did note the start of some yeast infection symptoms so she started a probiotic with resolution of symptoms.  Notes that ear pain resolved and sinus pain improved but still having sinus tenderness and fatigue.  Does note some chest tightness with exertion.  No overt shortness of breath.  Notes when she is exercising she will get a dry coughing spell.  Denies chest congestion.  Denies fever, chills, aches.  Denies loss of taste or smell.  Denies recent travel or sick contact.  Has been taking Zyrtec daily.  ROS:  See pertinent positives and negatives per HPI.  Patient Active Problem List   Diagnosis Date Noted  . Obesity (BMI 30-39.9) 11/29/2018  . Visit for preventive health examination 10/16/2016  . Depression     Social History   Tobacco Use  . Smoking status: Never Smoker  . Smokeless tobacco: Never Used  Substance Use Topics  . Alcohol use: Yes      Alcohol/week: 0.0 - 2.0 standard drinks    Current Outpatient Medications:  .  buPROPion (WELLBUTRIN XL) 300 MG 24 hr tablet, TAKE 1 TABLET BY MOUTH EVERY DAY, Disp: 90 tablet, Rfl: 1 .  Cyanocobalamin (B12 LIQUID HEALTH BOOSTER PO), , Disp: , Rfl:  .  Melatonin 10 MG TABS, Take 1 tablet by mouth at bedtime., Disp: , Rfl:   No Known Allergies  Objective:   There were no vitals taken for this visit.  Patient is well-developed, well-nourished in no acute distress.  Resting comfortably at home.  Head is normocephalic, atraumatic.  No labored breathing.  Speech is clear and coherent with logical content.  Patient is alert and oriented at baseline.   Assessment and Plan:   1. Acute bacterial sinusitis 2. Bronchospasm Residual symptoms of sinusitis after 7-day course of amoxicillin.  Will Rx doxycycline 100 mg twice daily x10 days.  Patient to start Mucinex DM.  Rx Nasacort to use in addition to daily Zyrtec.  Patient had negative Covid assessment.  Chest tightness with exertion seems more related to just reactive airway and bronchospasm.  Rx albuterol inhaler.  Strict precautions for Covid retesting discussed with patient..  Increase fluids.  Rest.  Saline nasal spray.  Probiotic.  Mucinex as directed.  Humidifier in bedroom.  Call or return to clinic if symptoms are not improving.     Piedad Climes, [place] 09/18/2019

## 2019-09-18 NOTE — Patient Instructions (Signed)
Instructions sent to MyChart

## 2019-09-18 NOTE — Progress Notes (Signed)
I have discussed the procedure for the virtual visit with the patient who has given consent to proceed with assessment and treatment.   Meyer Dockery S Cheria Sadiq, CMA     

## 2019-09-20 DIAGNOSIS — Z20822 Contact with and (suspected) exposure to covid-19: Secondary | ICD-10-CM | POA: Diagnosis not present

## 2019-09-22 DIAGNOSIS — F3281 Premenstrual dysphoric disorder: Secondary | ICD-10-CM | POA: Diagnosis not present

## 2019-09-22 DIAGNOSIS — F3341 Major depressive disorder, recurrent, in partial remission: Secondary | ICD-10-CM | POA: Diagnosis not present

## 2019-09-25 DIAGNOSIS — Z03818 Encounter for observation for suspected exposure to other biological agents ruled out: Secondary | ICD-10-CM | POA: Diagnosis not present

## 2019-09-25 DIAGNOSIS — M9901 Segmental and somatic dysfunction of cervical region: Secondary | ICD-10-CM | POA: Diagnosis not present

## 2019-09-25 DIAGNOSIS — Z20822 Contact with and (suspected) exposure to covid-19: Secondary | ICD-10-CM | POA: Diagnosis not present

## 2019-10-04 DIAGNOSIS — F3341 Major depressive disorder, recurrent, in partial remission: Secondary | ICD-10-CM | POA: Diagnosis not present

## 2019-10-04 DIAGNOSIS — F3281 Premenstrual dysphoric disorder: Secondary | ICD-10-CM | POA: Diagnosis not present

## 2019-10-10 DIAGNOSIS — F3341 Major depressive disorder, recurrent, in partial remission: Secondary | ICD-10-CM | POA: Diagnosis not present

## 2019-10-10 DIAGNOSIS — F3281 Premenstrual dysphoric disorder: Secondary | ICD-10-CM | POA: Diagnosis not present

## 2019-10-18 DIAGNOSIS — M9901 Segmental and somatic dysfunction of cervical region: Secondary | ICD-10-CM | POA: Diagnosis not present

## 2019-10-20 DIAGNOSIS — F3341 Major depressive disorder, recurrent, in partial remission: Secondary | ICD-10-CM | POA: Diagnosis not present

## 2019-10-20 DIAGNOSIS — F3281 Premenstrual dysphoric disorder: Secondary | ICD-10-CM | POA: Diagnosis not present

## 2019-11-03 DIAGNOSIS — F3281 Premenstrual dysphoric disorder: Secondary | ICD-10-CM | POA: Diagnosis not present

## 2019-11-03 DIAGNOSIS — F3341 Major depressive disorder, recurrent, in partial remission: Secondary | ICD-10-CM | POA: Diagnosis not present

## 2019-11-13 DIAGNOSIS — Z20822 Contact with and (suspected) exposure to covid-19: Secondary | ICD-10-CM | POA: Diagnosis not present

## 2019-11-15 DIAGNOSIS — M9901 Segmental and somatic dysfunction of cervical region: Secondary | ICD-10-CM | POA: Diagnosis not present

## 2019-12-13 DIAGNOSIS — M9901 Segmental and somatic dysfunction of cervical region: Secondary | ICD-10-CM | POA: Diagnosis not present

## 2019-12-29 DIAGNOSIS — Z20822 Contact with and (suspected) exposure to covid-19: Secondary | ICD-10-CM | POA: Diagnosis not present

## 2020-04-18 DIAGNOSIS — L680 Hirsutism: Secondary | ICD-10-CM | POA: Diagnosis not present

## 2020-04-18 DIAGNOSIS — E282 Polycystic ovarian syndrome: Secondary | ICD-10-CM | POA: Diagnosis not present

## 2020-06-12 DIAGNOSIS — K5900 Constipation, unspecified: Secondary | ICD-10-CM | POA: Diagnosis not present

## 2020-06-12 DIAGNOSIS — K648 Other hemorrhoids: Secondary | ICD-10-CM | POA: Diagnosis not present

## 2020-07-11 ENCOUNTER — Encounter (HOSPITAL_COMMUNITY): Payer: Self-pay

## 2020-07-11 ENCOUNTER — Ambulatory Visit (HOSPITAL_COMMUNITY)
Admission: RE | Admit: 2020-07-11 | Discharge: 2020-07-11 | Disposition: A | Discharge status: Home / Self Care | Payer: BLUE CROSS/BLUE SHIELD | Source: Ambulatory Visit | Attending: Urgent Care | Admitting: Urgent Care

## 2020-07-11 ENCOUNTER — Other Ambulatory Visit: Payer: Self-pay

## 2020-07-11 VITALS — BP 108/68 | HR 71 | Temp 97.8°F | Resp 18

## 2020-07-11 DIAGNOSIS — B373 Candidiasis of vulva and vagina: Secondary | ICD-10-CM | POA: Diagnosis not present

## 2020-07-11 DIAGNOSIS — N760 Acute vaginitis: Secondary | ICD-10-CM | POA: Insufficient documentation

## 2020-07-11 DIAGNOSIS — B3731 Acute candidiasis of vulva and vagina: Secondary | ICD-10-CM

## 2020-07-11 LAB — POCT URINALYSIS DIPSTICK, ED / UC
Bilirubin Urine: NEGATIVE
Glucose, UA: NEGATIVE mg/dL
Hgb urine dipstick: NEGATIVE
Ketones, ur: NEGATIVE mg/dL
Nitrite: NEGATIVE
Protein, ur: NEGATIVE mg/dL
Specific Gravity, Urine: 1.01 (ref 1.005–1.030)
Urobilinogen, UA: 0.2 mg/dL (ref 0.0–1.0)
pH: 7 (ref 5.0–8.0)

## 2020-07-11 LAB — POC URINE PREG, ED: Preg Test, Ur: NEGATIVE

## 2020-07-11 MED ORDER — FLUCONAZOLE 150 MG PO TABS: 150.0000 mg | ORAL_TABLET | ORAL | 0 refills | Status: DC

## 2020-07-11 NOTE — ED Triage Notes (Signed)
Pt c/o vaginal discharge and itching x 1 week.  Pt denies burning when urinating.  She states she has been needing to constantly urinate.

## 2020-07-11 NOTE — ED Provider Notes (Signed)
[facility]   MRN: [id number] DOB: [birth year]  Subjective:   Tabitha Huff is a 30 y.o. female presenting for 1 week history of persistent vaginal discharge, genital irritation and itching.  Has started to have more urinary frequency and urinary urgency.  Denies fever, nausea, vomiting, pelvic pain, genital rash.  Patient practices safe sex consistently.  Has a history of yeast infection but has not had one for years.  No current facility-administered medications for this encounter.  Current Outpatient Medications:    albuterol (VENTOLIN HFA) 108 (90 Base) MCG/ACT inhaler, Inhale 2 puffs into the lungs every 6 (six) hours as needed for wheezing or shortness of breath., Disp: 8 g, Rfl: 0   buPROPion (WELLBUTRIN XL) 300 MG 24 hr tablet, TAKE 1 TABLET BY MOUTH EVERY DAY, Disp: 90 tablet, Rfl: 1   Cyanocobalamin (B12 LIQUID HEALTH BOOSTER PO), , Disp: , Rfl:    doxycycline (VIBRAMYCIN) 100 MG capsule, Take 1 capsule (100 mg total) by mouth 2 (two) times daily., Disp: 20 capsule, Rfl: 0   Melatonin 10 MG TABS, Take 1 tablet by mouth at bedtime., Disp: , Rfl:    triamcinolone (NASACORT) 55 MCG/ACT AERO nasal inhaler, Place 2 sprays into the nose daily., Disp: 1 each, Rfl: 2   No Known Allergies  Past Medical History:  Diagnosis Date   Anemia    Iron Deficiency -- Diagnosed when she was a teenager.   Depression    Diagnosed Fall 2016.     Past Surgical History:  Procedure Laterality Date   NO PAST SURGERIES      Family History  Problem Relation Age of Onset   Cancer Maternal Grandfather        lung cancer   Cancer Paternal Grandmother        skin cancer   Diabetes Paternal Grandfather    Hepatitis C Mother        On treatment   Cancer Father        Skin   Cancer Maternal Uncle     Social History   Tobacco Use   Smoking status: Never   Smokeless tobacco: Never  Vaping Use   Vaping Use: Never used  Substance Use Topics   Alcohol use: Yes     Alcohol/week: 0.0 - 2.0 standard drinks   Drug use: No    ROS   Objective:   Vitals: BP 108/68   Pulse 71   Temp 97.8 F (36.6 C) (Oral)   Resp 18   LMP 06/12/2020 (Exact Date)   SpO2 98%   Physical Exam Constitutional:      General: She is not in acute distress.    Appearance: Normal appearance. She is well-developed. She is not ill-appearing, toxic-appearing or diaphoretic.  HENT:     Head: Normocephalic and atraumatic.     Nose: Nose normal.     Mouth/Throat:     Mouth: Mucous membranes are moist.     Pharynx: Oropharynx is clear.  Eyes:     General: No scleral icterus.    Extraocular Movements: Extraocular movements intact.     Pupils: Pupils are equal, round, and reactive to light.  Cardiovascular:     Rate and Rhythm: Normal rate.  Pulmonary:     Effort: Pulmonary effort is normal.  Abdominal:     General: Bowel sounds are normal. There is no distension.     Palpations: Abdomen is soft. There is no mass.     Tenderness: There is no  abdominal tenderness. There is no right CVA tenderness, left CVA tenderness, guarding or rebound.  Skin:    General: Skin is warm and dry.  Neurological:     General: No focal deficit present.     Mental Status: She is alert and oriented to person, place, and time.  Psychiatric:        Mood and Affect: Mood normal.        Behavior: Behavior normal.        Thought Content: Thought content normal.        Judgment: Judgment normal.    Results for orders placed or performed during the hospital encounter of 07/11/20 (from the past 24 hour(s))  POC Urinalysis dipstick     Status: Abnormal   Collection Time: 07/11/20  1:21 PM  Result Value Ref Range   Glucose, UA NEGATIVE NEGATIVE mg/dL   Bilirubin Urine NEGATIVE NEGATIVE   Ketones, ur NEGATIVE NEGATIVE mg/dL   Specific Gravity, Urine 1.010 1.005 - 1.030   Hgb urine dipstick NEGATIVE NEGATIVE   pH 7.0 5.0 - 8.0   Protein, ur NEGATIVE NEGATIVE mg/dL   Urobilinogen, UA 0.2 0.0 -  1.0 mg/dL   Nitrite NEGATIVE NEGATIVE   Leukocytes,Ua SMALL (A) NEGATIVE  POC urine pregnancy     Status: None   Collection Time: 07/11/20  1:25 PM  Result Value Ref Range   Preg Test, Ur NEGATIVE NEGATIVE    Assessment and Plan :   PDMP not reviewed this encounter.  1. Yeast vaginitis   2. Acute vaginitis     Start Diflucan for what I suspect is yeast vaginitis.  Labs pending. Counseled patient on potential for adverse effects with medications prescribed/recommended today, ER and return-to-clinic precautions discussed, patient verbalized understanding.    Wallis Bamberg, PA-C 07/11/20 1401

## 2020-07-12 ENCOUNTER — Telehealth (HOSPITAL_COMMUNITY): Payer: Self-pay | Admitting: Emergency Medicine

## 2020-07-12 LAB — CERVICOVAGINAL ANCILLARY ONLY
Bacterial Vaginitis (gardnerella): POSITIVE — AB
Candida Glabrata: NEGATIVE
Candida Vaginitis: POSITIVE — AB
Chlamydia: NEGATIVE
Comment: NEGATIVE
Comment: NEGATIVE
Comment: NEGATIVE
Comment: NEGATIVE
Comment: NEGATIVE
Comment: NORMAL
Neisseria Gonorrhea: NEGATIVE
Trichomonas: NEGATIVE

## 2020-07-12 MED ORDER — METRONIDAZOLE 500 MG PO TABS: 500.0000 mg | ORAL_TABLET | Freq: Two times a day (BID) | ORAL | 0 refills | Status: DC

## 2020-07-22 ENCOUNTER — Encounter: Payer: Self-pay | Admitting: Family

## 2020-07-22 ENCOUNTER — Telehealth: Payer: BLUE CROSS/BLUE SHIELD

## 2020-07-22 ENCOUNTER — Telehealth: Payer: BLUE CROSS/BLUE SHIELD | Admitting: Family

## 2020-07-22 DIAGNOSIS — U071 COVID-19: Secondary | ICD-10-CM

## 2020-07-22 MED ORDER — BENZONATATE 100 MG PO CAPS: 100.0000 mg | ORAL_CAPSULE | Freq: Three times a day (TID) | ORAL | 0 refills | Status: DC | PRN

## 2020-07-22 MED ORDER — MOLNUPIRAVIR EUA 200MG CAPSULE: 4.0000 | ORAL_CAPSULE | Freq: Two times a day (BID) | ORAL | 0 refills | Status: AC

## 2020-07-22 NOTE — Progress Notes (Signed)
Virtual Visit Consent   Tabitha Huff, you are scheduled for a virtual visit with a [facility] provider today.     Just as with appointments in the office, your consent must be obtained to participate.  Your consent will be active for this visit and any virtual visit you may have with one of our providers in the next 365 days.     If you have a MyChart account, a copy of this consent can be sent to you electronically.  All virtual visits are billed to your insurance company just like a traditional visit in the office.    As this is a virtual visit, video technology does not allow for your provider to perform a traditional examination.  This may limit your provider's ability to fully assess your condition.  If your provider identifies any concerns that need to be evaluated in person or the need to arrange testing (such as labs, EKG, etc.), we will make arrangements to do so.     Although advances in technology are sophisticated, we cannot ensure that it will always work on either your end or our end.  If the connection with a video visit is poor, the visit may have to be switched to a telephone visit.  With either a video or telephone visit, we are not always able to ensure that we have a secure connection.     I need to obtain your verbal consent now.   Are you willing to proceed with your visit today?    Tabitha Huff has provided verbal consent on 07/22/2020 for a virtual visit (video or telephone).   Jannifer Rodney, FNP   Date: 07/22/2020 2:47 PM   Virtual Visit via Video Note   IJannifer Rodney, connected [email or web address]@ ([id number], March 27, 1990) on 07/22/20 at  2:45 PM EDT by a video-enabled telemedicine application and verified that I am speaking with the correct person using two identifiers.  Location: Patient: Virtual Visit Location Patient: Home Provider: Virtual Visit Location Provider: Home   I discussed the limitations of evaluation and management by telemedicine and the  availability of in person appointments. The patient expressed understanding and agreed to proceed.    History of Present Illness: Tabitha Huff is a 30 y.o. who identifies as a female who was assigned female at birth, and is being seen today for COVID.  PT presents today with COVID. She reports her symptoms started on 07/20/20 and had a positive COVID teste on 07/21/20.  HPI: Cough This is a new problem. The current episode started in the past 7 days. The problem has been gradually worsening. The problem occurs every few minutes. The cough is Productive of sputum. Associated symptoms include chills, ear congestion, ear pain, a fever, headaches, myalgias, nasal congestion, postnasal drip and shortness of breath. Pertinent negatives include no wheezing. She has tried rest (tylenol) for the symptoms. The treatment provided mild relief.     Problems:  Patient Active Problem List   Diagnosis Date Noted   Obesity (BMI 30-39.9) 11/29/2018   Visit for preventive health examination 10/16/2016   Depression     Allergies: No Known Allergies Medications:  Current Outpatient Medications:    benzonatate (TESSALON PERLES) 100 MG capsule, Take 1 capsule (100 mg total) by mouth 3 (three) times daily as needed., Disp: 20 capsule, Rfl: 0   molnupiravir EUA 200 mg CAPS, Take 4 capsules (800 mg total) by mouth 2 (two) times daily for 5 days., Disp: 40 capsule, Rfl: 0   albuterol (VENTOLIN  HFA) 108 (90 Base) MCG/ACT inhaler, Inhale 2 puffs into the lungs every 6 (six) hours as needed for wheezing or shortness of breath., Disp: 8 g, Rfl: 0   buPROPion (WELLBUTRIN XL) 300 MG 24 hr tablet, TAKE 1 TABLET BY MOUTH EVERY DAY, Disp: 90 tablet, Rfl: 1   Cyanocobalamin (B12 LIQUID HEALTH BOOSTER PO), , Disp: , Rfl:    doxycycline (VIBRAMYCIN) 100 MG capsule, Take 1 capsule (100 mg total) by mouth 2 (two) times daily., Disp: 20 capsule, Rfl: 0   fluconazole (DIFLUCAN) 150 MG tablet, Take 1 tablet (150 mg total) by  mouth once a week., Disp: 2 tablet, Rfl: 0   Melatonin 10 MG TABS, Take 1 tablet by mouth at bedtime., Disp: , Rfl:    metroNIDAZOLE (FLAGYL) 500 MG tablet, Take 1 tablet (500 mg total) by mouth 2 (two) times daily., Disp: 14 tablet, Rfl: 0   triamcinolone (NASACORT) 55 MCG/ACT AERO nasal inhaler, Place 2 sprays into the nose daily., Disp: 1 each, Rfl: 2  Observations/Objective: Patient is well-developed, well-nourished in no acute distress.  Resting comfortably  at home.  Head is normocephalic, atraumatic.  No labored breathing. Speech is clear and coherent with logical content.  Patient is alert and oriented at baseline.   Assessment and Plan: 1. COVID-19 virus detected - molnupiravir EUA 200 mg CAPS; Take 4 capsules (800 mg total) by mouth 2 (two) times daily for 5 days.  Dispense: 40 capsule; Refill: 0 - MyChart COVID-19 home monitoring program; Future - benzonatate (TESSALON PERLES) 100 MG capsule; Take 1 capsule (100 mg total) by mouth 3 (three) times daily as needed.  Dispense: 20 capsule; Refill: 0 COVID positive, rest, force fluids, tylenol as needed, Quarantine for at least 5 days and fever free, report any worsening symptoms such as increased shortness of breath, swelling, or continued high fevers.  Discussed avoid becoming pregnant or breastfeeding   Follow Up Instructions: I discussed the assessment and treatment plan with the patient. The patient was provided an opportunity to ask questions and all were answered. The patient agreed with the plan and demonstrated an understanding of the instructions.  A copy of instructions were sent to the patient via MyChart.  The patient was advised to call back or seek an in-person evaluation if the symptoms worsen or if the condition fails to improve as anticipated.  Time:  I spent  12 minutes with the patient via telehealth technology discussing the above problems/concerns.    Jannifer Rodney, FNP

## 2020-09-13 DIAGNOSIS — Z0000 Encounter for general adult medical examination without abnormal findings: Secondary | ICD-10-CM | POA: Diagnosis not present

## 2020-09-13 DIAGNOSIS — Z23 Encounter for immunization: Secondary | ICD-10-CM | POA: Diagnosis not present

## 2020-09-13 DIAGNOSIS — Z0189 Encounter for other specified special examinations: Secondary | ICD-10-CM | POA: Diagnosis not present

## 2020-10-04 DIAGNOSIS — Z23 Encounter for immunization: Secondary | ICD-10-CM | POA: Diagnosis not present

## 2020-10-17 ENCOUNTER — Other Ambulatory Visit: Payer: Self-pay

## 2020-10-18 ENCOUNTER — Encounter: Payer: Self-pay | Admitting: Internal Medicine

## 2020-10-18 ENCOUNTER — Ambulatory Visit: Payer: BLUE CROSS/BLUE SHIELD | Admitting: Internal Medicine

## 2020-10-18 VITALS — BP 110/80 | HR 88 | Temp 97.6°F | Ht 68.0 in | Wt 249.8 lb

## 2020-10-18 DIAGNOSIS — Z124 Encounter for screening for malignant neoplasm of cervix: Secondary | ICD-10-CM | POA: Diagnosis not present

## 2020-10-18 DIAGNOSIS — E669 Obesity, unspecified: Secondary | ICD-10-CM

## 2020-10-18 DIAGNOSIS — Z0000 Encounter for general adult medical examination without abnormal findings: Secondary | ICD-10-CM | POA: Diagnosis not present

## 2020-10-18 MED ORDER — TIRZEPATIDE 5 MG/0.5ML ~~LOC~~ SOAJ: 5.0000 mg | SUBCUTANEOUS | 2 refills | Status: AC

## 2020-10-18 NOTE — Progress Notes (Signed)
New Patient Office Visit     This visit occurred during the SARS-CoV-2 public health emergency.  Safety protocols were in place, including screening questions prior to the visit, additional usage of staff PPE, and extensive cleaning of exam room while observing appropriate contact time as indicated for disinfecting solutions.    CC/Reason for Visit: Establish care, discuss chronic medical conditions Previous PCP: Marcelline Mates, PA Last Visit: 2021  HPI: Tabitha Huff is a 30 y.o. female who is coming in today for the above mentioned reasons. Past Medical History is significant for: Obesity and depression who is now not on any medications.  She is doing well.  She is currently in nursing school and needs to have varicella titers after she just got the vaccine 2 months ago as she was nonimmune.  She does not smoke, she drinks alcohol occasionally.  She has no known drug allergies.  She has no past surgical history.  Family history significant for a paternal grand father with diabetes, maternal grandmother who had lung cancer and was a heavy smoker, mother with hepatitis C.  Flu vaccine, Tdap are up-to-date.  She has yet to have her COVID booster, she is requesting GYN referral.  She would like to talk about possible options for weight loss.   Past Medical/Surgical History: Past Medical History:  Diagnosis Date   Anemia    Iron Deficiency -- Diagnosed when she was a teenager.   Depression    Diagnosed Fall 2016.    Past Surgical History:  Procedure Laterality Date   NO PAST SURGERIES      Social History:  reports that she has never smoked. She has never used smokeless tobacco. She reports current alcohol use. She reports that she does not use drugs.  Allergies: No Known Allergies  Family History:  Family History  Problem Relation Age of Onset   Cancer Maternal Grandfather        lung cancer   Cancer Paternal Grandmother        skin cancer   Diabetes Paternal  Grandfather    Hepatitis C Mother        On treatment   Cancer Father        Skin   Cancer Maternal Uncle      Current Outpatient Medications:    Cyanocobalamin (B12 LIQUID HEALTH BOOSTER PO), , Disp: , Rfl:    Melatonin 10 MG TABS, Take 1 tablet by mouth at bedtime., Disp: , Rfl:    tirzepatide (MOUNJARO) 5 MG/0.5ML Pen, Inject 5 mg into the skin once a week., Disp: 2 mL, Rfl: 2  Review of Systems:  Constitutional: Denies fever, chills, diaphoresis, appetite change and fatigue.  HEENT: Denies photophobia, eye pain, redness, hearing loss, ear pain, congestion, sore throat, rhinorrhea, sneezing, mouth sores, trouble swallowing, neck pain, neck stiffness and tinnitus.   Respiratory: Denies SOB, DOE, cough, chest tightness,  and wheezing.   Cardiovascular: Denies chest pain, palpitations and leg swelling.  Gastrointestinal: Denies nausea, vomiting, abdominal pain, diarrhea, constipation, blood in stool and abdominal distention.  Genitourinary: Denies dysuria, urgency, frequency, hematuria, flank pain and difficulty urinating.  Endocrine: Denies: hot or cold intolerance, sweats, changes in hair or nails, polyuria, polydipsia. Musculoskeletal: Denies myalgias, back pain, joint swelling, arthralgias and gait problem.  Skin: Denies pallor, rash and wound.  Neurological: Denies dizziness, seizures, syncope, weakness, light-headedness, numbness and headaches.  Hematological: Denies adenopathy. Easy bruising, personal or family bleeding history  Psychiatric/Behavioral: Denies suicidal ideation, mood changes, confusion, nervousness, sleep  disturbance and agitation    Physical Exam: Vitals:   10/18/20 1336  BP: 110/80  Pulse: 88  Temp: 97.6 F (36.4 C)  TempSrc: Oral  SpO2: 98%  Weight: 249 lb 12.8 oz (113.3 kg)  Height: 5\' 8"  (1.727 m)   Body mass index is 37.98 kg/m.   Constitutional: NAD, calm, comfortable Eyes: PERRL, lids and conjunctivae normal ENMT: Mucous membranes are  moist.  Respiratory: clear to auscultation bilaterally, no wheezing, no crackles. Normal respiratory effort. No accessory muscle use.  Cardiovascular: Regular rate and rhythm, no murmurs / rubs / gallops. No extremity edema.  Neurologic: Grossly intact and nonfocal Psychiatric: Normal judgment and insight. Alert and oriented x 3. Normal mood.    Impression and Plan:  Visit for preventive health examination  - Plan: Varicella Zoster Antibody, IgG -She has routine eye and dental care. -She will get COVID booster at pharmacy but otherwise immunizations are up-to-date. -Healthy lifestyle discussed in detail. -Commence routine breast cancer screening age 72 and colon cancer screening age 12. -She is overdue for Pap smear, refer to GI.  Screening for malignant neoplasm of cervix  - Plan: Ambulatory referral to Obstetrics / Gynecology  Obesity (BMI 30-39.9)  - Plan: tirzepatide 54) 5 MG/0.5ML Pen -Discussed healthy lifestyle, including increased physical activity and better food choices to promote weight loss. -We have discussed different medical options for weight loss, we have agreed to try [facility].  Sample given for 2.5 mg, I will send prescription for 5 mg, she will apply for patient assistance. -We have discussed necessity for an alternative method of birth control for a week after each dose increase.      Patient Instructions  -Nice seeing you today!!  -Lab work today; will notify you once results are available.  -Start Mounjaro 2.5 mg weekly for 4 weeks, then increase to 5 mg weekly.  -Schedule follow up in 1 year or sooner as needed.    [facility], MD [facility]

## 2020-10-18 NOTE — Patient Instructions (Signed)
-  Nice seeing you today!!  -Lab work today; will notify you once results are available.  -Start Mounjaro 2.5 mg weekly for 4 weeks, then increase to 5 mg weekly.  -Schedule follow up in 1 year or sooner as needed.

## 2020-10-21 LAB — VARICELLA ZOSTER ANTIBODY, IGG: Varicella IgG: 3812 index

## 2020-11-01 ENCOUNTER — Telehealth: Payer: BLUE CROSS/BLUE SHIELD | Admitting: Family Medicine

## 2020-11-01 DIAGNOSIS — N898 Other specified noninflammatory disorders of vagina: Secondary | ICD-10-CM

## 2020-11-01 NOTE — Progress Notes (Signed)
Needs in person testing, [place]

## 2020-12-12 ENCOUNTER — Telehealth: Payer: Self-pay

## 2020-12-12 NOTE — Telephone Encounter (Signed)
Patient called requesting Rx refill tirzepatide [facility]) 5 MG/0.5ML Pen

## 2020-12-13 NOTE — Telephone Encounter (Signed)
Okay to refill? 

## 2020-12-15 ENCOUNTER — Encounter: Payer: Self-pay | Admitting: Internal Medicine

## 2020-12-18 NOTE — Telephone Encounter (Signed)
See MyChart message

## 2021-01-02 ENCOUNTER — Encounter: Payer: Self-pay | Admitting: Family Medicine

## 2021-01-02 ENCOUNTER — Telehealth (INDEPENDENT_AMBULATORY_CARE_PROVIDER_SITE_OTHER): Payer: No Typology Code available for payment source | Admitting: Family Medicine

## 2021-01-02 VITALS — Temp 97.0°F

## 2021-01-02 DIAGNOSIS — R059 Cough, unspecified: Secondary | ICD-10-CM | POA: Diagnosis not present

## 2021-01-02 DIAGNOSIS — J029 Acute pharyngitis, unspecified: Secondary | ICD-10-CM | POA: Diagnosis not present

## 2021-01-02 NOTE — Patient Instructions (Signed)
-  trial over the counter nexium for 1 week  -if that does not work can try flonase for 1 week  -if cough persists schedule inperson evaluation with your primary care office   I hope you are feeling better soon!  Seek in person care promptly if your symptoms worsen, new concerns arise or you are not improving with treatment.  It was nice to meet you today. I help [place] out with telemedicine visits on Tuesdays and Thursdays and am happy to help if you need a virtual follow up visit on those days. Otherwise, if you have any concerns or questions following this visit please schedule a follow up visit with your Primary Care office or seek care at a local urgent care clinic to avoid delays in care

## 2021-01-02 NOTE — Progress Notes (Signed)
Virtual Visit via Video Note  I connected with Tabitha Huff  on 01/02/21 at  6:00 PM EST by a video enabled telemedicine application and verified that I am speaking with the correct person using two identifiers.  Location patient: home, [place] Location provider:work or home office Persons participating in the virtual visit: patient, provider  I discussed the limitations of evaluation and management by telemedicine and the availability of in person appointments. The patient expressed understanding and agreed to proceed.   HPI:  Acute telemedicine visit for a sore throat: -Onset: started about 5-7 days ago -Symptoms include: had sore throat, cough -now feeling better -Denies: fevers, CP, SOB, malaise, NVD -Pertinent past medical history:see below -Pertinent medication allergies: No Known Allergies -COVID-19 vaccine status: Immunization History  Administered Date(s) Administered   HPV Quadrivalent 01/27/2011   Influenza,inj,Quad PF,6+ Mos 10/04/2020   Influenza-Unspecified 10/04/2020   Meningococcal Conjugate 03/20/2008   PFIZER(Purple Top)SARS-COV-2 Vaccination 03/06/2019, 03/27/2019   Tdap 01/27/2011, 09/13/2020   Varicella 04/21/2004, 10/04/2020   Also, has had occ cough/dry a few times per week for a few weeks. No other symptoms.   ROS: See pertinent positives and negatives per HPI.  Past Medical History:  Diagnosis Date   Anemia    Iron Deficiency -- Diagnosed when she was a teenager.   Depression    Diagnosed Fall 2016.    Past Surgical History:  Procedure Laterality Date   NO PAST SURGERIES       Current Outpatient Medications:    Cyanocobalamin (B12 LIQUID HEALTH BOOSTER PO), , Disp: , Rfl:    Melatonin 10 MG TABS, Take 1 tablet by mouth at bedtime., Disp: , Rfl:    MOUNJARO 5 MG/0.5ML Pen, SMARTSIG:5 Milligram(s) SUB-Q Once a Week, Disp: , Rfl:   EXAM:  VITALS per patient if applicable:  GENERAL: alert, oriented, appears well and in no acute distress  HEENT:  atraumatic, conjunttiva clear, no obvious abnormalities on inspection of external nose and ears, on inspection of oropharynx of video visit mild post oropharyngeal erythema, no tonsilar edema or exudate  NECK: normal movements of the head and neck  LUNGS: on inspection no signs of respiratory distress, breathing rate appears normal, no obvious gross SOB, gasping or wheezing  CV: no obvious cyanosis  MS: moves all visible extremities without noticeable abnormality  PSYCH/NEURO: pleasant and cooperative, no obvious depression or anxiety, speech and thought processing grossly intact  ASSESSMENT AND PLAN:  Discussed the following assessment and plan:  Sore throat  Cough, unspecified type  -we discussed possible serious and likely etiologies, options for evaluation and workup, limitations of telemedicine visit vs in person visit, treatment, treatment risks and precautions. Pt is agreeable to treatment via telemedicine at this moment. Query VURI most like for acute symptoms vs other. Those have now resolved and exam is benign. Discussed causes of longer cough - post viral, GERD, PND most likey vs other. Advised could try PPI OTC for 1 week, INS but if not improving/resolved would advise inperson visit.  I discussed the assessment and treatment plan with the patient. The patient was provided an opportunity to ask questions and all were answered. The patient agreed with the plan and demonstrated an understanding of the instructions.     Terressa Koyanagi, DO

## 2021-01-28 ENCOUNTER — Other Ambulatory Visit (HOSPITAL_COMMUNITY)
Admission: RE | Admit: 2021-01-28 | Discharge: 2021-01-28 | Disposition: A | Discharge status: Home / Self Care | Payer: No Typology Code available for payment source | Source: Ambulatory Visit | Attending: Obstetrics & Gynecology | Admitting: Obstetrics & Gynecology

## 2021-01-28 ENCOUNTER — Ambulatory Visit (INDEPENDENT_AMBULATORY_CARE_PROVIDER_SITE_OTHER): Payer: No Typology Code available for payment source | Admitting: Family Medicine

## 2021-01-28 ENCOUNTER — Encounter: Payer: Self-pay | Admitting: Family Medicine

## 2021-01-28 ENCOUNTER — Other Ambulatory Visit: Payer: Self-pay

## 2021-01-28 VITALS — BP 117/78 | HR 84 | Wt 219.0 lb

## 2021-01-28 DIAGNOSIS — Z01419 Encounter for gynecological examination (general) (routine) without abnormal findings: Secondary | ICD-10-CM

## 2021-01-28 DIAGNOSIS — Z124 Encounter for screening for malignant neoplasm of cervix: Secondary | ICD-10-CM | POA: Diagnosis not present

## 2021-01-28 DIAGNOSIS — Z113 Encounter for screening for infections with a predominantly sexual mode of transmission: Secondary | ICD-10-CM | POA: Insufficient documentation

## 2021-01-28 NOTE — Patient Instructions (Signed)
Preventive Care 21-31 Years Old, Female °Preventive care refers to lifestyle choices and visits with your health care provider that can promote health and wellness. Preventive care visits are also called wellness exams. °What can I expect for my preventive care visit? °Counseling °During your preventive care visit, your health care provider may ask about your: °Medical history, including: °Past medical problems. °Family medical history. °Pregnancy history. °Current health, including: °Menstrual cycle. °Method of birth control. °Emotional well-being. °Home life and relationship well-being. °Sexual activity and sexual health. °Lifestyle, including: °Alcohol, nicotine or tobacco, and drug use. °Access to firearms. °Diet, exercise, and sleep habits. °Work and work environment. °Sunscreen use. °Safety issues such as seatbelt and bike helmet use. °Physical exam °Your health care provider may check your: °Height and weight. These may be used to calculate your BMI (body mass index). BMI is a measurement that tells if you are at a healthy weight. °Waist circumference. This measures the distance around your waistline. This measurement also tells if you are at a healthy weight and may help predict your risk of certain diseases, such as type 2 diabetes and high blood pressure. °Heart rate and blood pressure. °Body temperature. °Skin for abnormal spots. °What immunizations do I need? °Vaccines are usually given at various ages, according to a schedule. Your health care provider will recommend vaccines for you based on your age, medical history, and lifestyle or other factors, such as travel or where you work. °What tests do I need? °Screening °Your health care provider may recommend screening tests for certain conditions. This may include: °Pelvic exam and Pap test. °Lipid and cholesterol levels. °Diabetes screening. This is done by checking your blood sugar (glucose) after you have not eaten for a while (fasting). °Hepatitis B  test. °Hepatitis C test. °HIV (human immunodeficiency virus) test. °STI (sexually transmitted infection) testing, if you are at risk. °BRCA-related cancer screening. This may be done if you have a family history of breast, ovarian, tubal, or peritoneal cancers. °Talk with your health care provider about your test results, treatment options, and if necessary, the need for more tests. °Follow these instructions at home: °Eating and drinking ° °Eat a healthy diet that includes fresh fruits and vegetables, whole grains, lean protein, and low-fat dairy products. °Take vitamin and mineral supplements as recommended by your health care provider. °Do not drink alcohol if: °Your health care provider tells you not to drink. °You are pregnant, may be pregnant, or are planning to become pregnant. °If you drink alcohol: °Limit how much you have to 0-1 drink a day. °Know how much alcohol is in your drink. In the [place] one drink equals one 12 oz bottle of beer (355 mL), one 5 oz glass of wine (148 mL), or one 1½ oz glass of hard liquor (44 mL). °Lifestyle °Brush your teeth every morning and night with fluoride toothpaste. Floss one time each day. °Exercise for at least 30 minutes 5 or more days each week. °Do not use any products that contain nicotine or tobacco. These products include cigarettes, chewing tobacco, and vaping devices, such as e-cigarettes. If you need help quitting, ask your health care provider. °Do not use drugs. °If you are sexually active, practice safe sex. Use a condom or other form of protection to prevent STIs. °If you do not wish to become pregnant, use a form of birth control. If you plan to become pregnant, see your health care provider for a prepregnancy visit. °Find healthy ways to manage stress, such as: °Meditation, yoga,   or listening to music. °Journaling. °Talking to a trusted person. °Spending time with friends and family. °Minimize exposure to UV radiation to reduce your risk of skin  cancer. °Safety °Always wear your seat belt while driving or riding in a vehicle. °Do not drive: °If you have been drinking alcohol. Do not ride with someone who has been drinking. °If you have been using any mind-altering substances or drugs. °While texting. °When you are tired or distracted. °Wear a helmet and other protective equipment during sports activities. °If you have firearms in your house, make sure you follow all gun safety procedures. °Seek help if you have been physically or sexually abused. °What's next? °Go to your health care provider once a year for an annual wellness visit. °Ask your health care provider how often you should have your eyes and teeth checked. °Stay up to date on all vaccines. °This information is not intended to replace advice given to you by your health care provider. Make sure you discuss any questions you have with your health care provider. °Document Revised: 07/10/2020 Document Reviewed: 07/10/2020 °Elsevier Patient Education © 2022 [employer] Inc. ° °

## 2021-01-28 NOTE — Progress Notes (Signed)
Pt states she recently had BV - used boric acid, would like check today to see if clear.  Pt would also like std screening.   Last pap 2018- [facility]

## 2021-01-28 NOTE — Progress Notes (Signed)
Subjective:     Tabitha Huff is a 31 y.o. female and is here for a comprehensive physical exam. The patient reports no problems. Cycles are regular, and not too heavy and not very painful.   The following portions of the patient's history were reviewed and updated as appropriate: allergies, current medications, past family history, past medical history, past social history, past surgical history, and problem list.  Review of Systems Pertinent items noted in HPI and remainder of comprehensive ROS otherwise negative.   Objective:  Chaperone present for exam   BP 117/78    Pulse 84    Wt 219 lb (99.3 kg)    LMP 01/25/2021    BMI 33.30 kg/m  General appearance: alert, cooperative, and appears stated age Head: Normocephalic, without obvious abnormality, atraumatic Neck: no adenopathy, supple, symmetrical, trachea midline, and thyroid not enlarged, symmetric, no tenderness/mass/nodules Lungs: clear to auscultation bilaterally Breasts: normal appearance, no masses or tenderness Heart: regular rate and rhythm, S1, S2 normal, no murmur, click, rub or gallop Abdomen: soft, non-tender; bowel sounds normal; no masses,  no organomegaly Pelvic: cervix normal in appearance, external genitalia normal, no adnexal masses or tenderness, no cervical motion tenderness, uterus normal size, shape, and consistency, and vagina normal without discharge Extremities: extremities normal, atraumatic, no cyanosis or edema Pulses: 2+ and symmetric Skin: Skin color, texture, turgor normal. No rashes or lesions Lymph nodes: Cervical, supraclavicular, and axillary nodes normal. Neurologic: Grossly normal    Assessment:    Healthy female exam.      Plan:    Screening for malignant neoplasm of cervix - Plan: Cytology - PAP( [facility])  Encounter for gynecological examination without abnormal finding - Continue condoms. other HM up to date.  Screen for STD (sexually transmitted disease) - Plan:  Cervicovaginal ancillary only( [place]), Hepatitis B surface antigen, Hepatitis C antibody, HIV Antibody (routine testing w rflx), RPR  Return in 1 year (on 01/28/2022).  See After Visit Summary for Counseling Recommendations

## 2021-01-29 LAB — HEPATITIS C ANTIBODY: Hep C Virus Ab: <0.1 s/co ratio (ref 0.0–0.9)

## 2021-01-29 LAB — RPR: RPR Ser Ql: NONREACTIVE

## 2021-01-29 LAB — CERVICOVAGINAL ANCILLARY ONLY
Chlamydia: NEGATIVE
Comment: NEGATIVE
Comment: NEGATIVE
Comment: NORMAL
Neisseria Gonorrhea: NEGATIVE
Trichomonas: NEGATIVE

## 2021-01-29 LAB — HEPATITIS B SURFACE ANTIGEN: Hepatitis B Surface Ag: NEGATIVE

## 2021-01-29 LAB — HIV ANTIBODY (ROUTINE TESTING W REFLEX): HIV Screen 4th Generation wRfx: NONREACTIVE

## 2021-01-30 ENCOUNTER — Ambulatory Visit: Payer: No Typology Code available for payment source | Admitting: Internal Medicine

## 2021-01-30 VITALS — BP 124/70 | HR 79 | Temp 97.4°F | Ht 68.0 in | Wt 220.1 lb

## 2021-01-30 DIAGNOSIS — J069 Acute upper respiratory infection, unspecified: Secondary | ICD-10-CM

## 2021-01-30 DIAGNOSIS — E669 Obesity, unspecified: Secondary | ICD-10-CM | POA: Diagnosis not present

## 2021-01-30 LAB — CYTOLOGY - PAP
Comment: NEGATIVE
Diagnosis: NEGATIVE
High risk HPV: NEGATIVE

## 2021-01-30 MED ORDER — MOUNJARO 5 MG/0.5ML ~~LOC~~ SOAJ: 5.0000 mg | SUBCUTANEOUS | 0 refills | Status: DC

## 2021-01-30 NOTE — Progress Notes (Signed)
Established Patient Office Visit     This visit occurred during the SARS-CoV-2 public health emergency.  Safety protocols were in place, including screening questions prior to the visit, additional usage of staff PPE, and extensive cleaning of exam room while observing appropriate contact time as indicated for disinfecting solutions.    CC/Reason for Visit: Discuss URI/ear pain, obesity  HPI: Tabitha Huff is a 31 y.o. female who is coming in today for the above mentioned reasons. Past Medical History is significant for: Obesity, depression.  She has done well on Mounjaro 5 mg and has lost 30 pounds since September.  The day after Christmas she went to urgent care with a left-sided earache.  She was diagnosed with left otitis media.  She was prescribed amoxicillin for 10 days.  She has completed this, she still has some hearing loss and a sensation of ear fullness.  She tells me that strep and COVID tests were negative.   Past Medical/Surgical History: Past Medical History:  Diagnosis Date   Anemia    Iron Deficiency -- Diagnosed when she was a teenager.   Depression    Diagnosed Fall 2016.    Past Surgical History:  Procedure Laterality Date   NO PAST SURGERIES      Social History:  reports that she has never smoked. She has never used smokeless tobacco. She reports current alcohol use. She reports that she does not use drugs.  Allergies: No Known Allergies  Family History:  Family History  Problem Relation Age of Onset   Hepatitis C Mother        On treatment   Cancer Father        Skin   Cancer Maternal Uncle        Prostate   Cancer Maternal Grandfather        lung cancer   Cancer Paternal Grandmother        skin cancer   Diabetes Paternal Grandfather      Current Outpatient Medications:    Cyanocobalamin (B12 LIQUID HEALTH BOOSTER PO), , Disp: , Rfl:    Melatonin 10 MG TABS, Take 1 tablet by mouth at bedtime., Disp: , Rfl:    MOUNJARO 5  MG/0.5ML Pen, Inject 5 mg into the skin once a week., Disp: 6 mL, Rfl: 0  Review of Systems:  Constitutional: Denies fever, chills, diaphoresis, appetite change and fatigue.  HEENT: Denies photophobia, eye pain, redness,  mouth sores, trouble swallowing, neck pain, neck stiffness and tinnitus.   Respiratory: Denies SOB, DOE,  chest tightness,  and wheezing.   Cardiovascular: Denies chest pain, palpitations and leg swelling.  Gastrointestinal: Denies nausea, vomiting, abdominal pain, diarrhea, constipation, blood in stool and abdominal distention.  Genitourinary: Denies dysuria, urgency, frequency, hematuria, flank pain and difficulty urinating.  Endocrine: Denies: hot or cold intolerance, sweats, changes in hair or nails, polyuria, polydipsia. Musculoskeletal: Denies myalgias, back pain, joint swelling, arthralgias and gait problem.  Skin: Denies pallor, rash and wound.  Neurological: Denies dizziness, seizures, syncope, weakness, light-headedness, numbness and headaches.  Hematological: Denies adenopathy. Easy bruising, personal or family bleeding history  Psychiatric/Behavioral: Denies suicidal ideation, mood changes, confusion, nervousness, sleep disturbance and agitation    Physical Exam: Vitals:   01/30/21 1517  BP: 124/70  Pulse: 79  Temp: (!) 97.4 F (36.3 C)  TempSrc: Oral  SpO2: 97%  Weight: 220 lb 1 oz (99.8 kg)  Height: 5\' 8"  (1.727 m)    Body mass index is 33.46 kg/m.  Constitutional: NAD, calm, comfortable Eyes: PERRL, lids and conjunctivae normal ENMT: Mucous membranes are moist. Posterior pharynx is erythematous but clear of any exudate or lesions. Normal dentition. Tympanic membrane is pearly white, no erythema or bulging. Respiratory: clear to auscultation bilaterally, no wheezing, no crackles. Normal respiratory effort. No accessory muscle use.  Psychiatric: Normal judgment and insight. Alert and oriented x 3. Normal mood.    Impression and  Plan:  Obesity (BMI 30-39.9)  - Plan: MOUNJARO 5 MG/0.5ML Pen -Discussed healthy lifestyle, including increased physical activity and better food choices to promote weight loss. -Congratulated on weight loss success thus far  Upper respiratory tract infection, unspecified type -Given exam findings, PNA, pharyngitis, ear infection are not likely, hence abx have not been prescribed. -Have advised rest, fluids, OTC antihistamines, cough suppressants and mucinex. -RTC if no improvement in 10-14 days.   Time spent: 32 minutes reviewing chart, interviewing and examining patient and formulating plan of care.  Also providing counseling on obesity.    Lelon Frohlich, MD [facility] at [facility]

## 2021-02-10 ENCOUNTER — Other Ambulatory Visit: Payer: Self-pay | Admitting: Internal Medicine

## 2021-02-10 DIAGNOSIS — E669 Obesity, unspecified: Secondary | ICD-10-CM

## 2021-05-07 ENCOUNTER — Other Ambulatory Visit: Payer: Self-pay | Admitting: Internal Medicine

## 2021-05-07 DIAGNOSIS — E669 Obesity, unspecified: Secondary | ICD-10-CM

## 2021-08-04 ENCOUNTER — Other Ambulatory Visit: Payer: Self-pay | Admitting: Internal Medicine

## 2021-08-04 DIAGNOSIS — E669 Obesity, unspecified: Secondary | ICD-10-CM

## 2021-08-07 ENCOUNTER — Other Ambulatory Visit: Payer: Self-pay | Admitting: Internal Medicine

## 2021-08-07 DIAGNOSIS — E669 Obesity, unspecified: Secondary | ICD-10-CM

## 2021-08-10 ENCOUNTER — Encounter: Payer: Self-pay | Admitting: Internal Medicine

## 2021-08-13 MED ORDER — WEGOVY 1.7 MG/0.75ML ~~LOC~~ SOAJ: 1.7000 mg | SUBCUTANEOUS | 0 refills | Status: DC

## 2021-08-19 ENCOUNTER — Other Ambulatory Visit (HOSPITAL_COMMUNITY): Payer: Self-pay

## 2021-08-19 NOTE — Telephone Encounter (Signed)
More information faxed about [facility]

## 2021-08-20 ENCOUNTER — Other Ambulatory Visit (HOSPITAL_COMMUNITY): Payer: Self-pay

## 2021-08-20 ENCOUNTER — Telehealth: Payer: Self-pay

## 2021-08-20 NOTE — Telephone Encounter (Signed)
Patient Advocate Encounter  Received a message in CoverMyMeds that prior authorization is needed for [facility], but patient insurance is not enrolled with CMM.  Contacted plan directly and was informed that medications in this category are not covered. Prior authorization would be denied.  Burnell Blanks, CPhT Pharmacy Patient Advocate Specialist [facility] Patient Advocate Team Phone: [phone]   Fax: [phone]

## 2021-10-07 ENCOUNTER — Other Ambulatory Visit: Payer: Self-pay | Admitting: Internal Medicine

## 2021-10-07 ENCOUNTER — Encounter: Payer: No Typology Code available for payment source | Admitting: Internal Medicine

## 2022-02-05 ENCOUNTER — Encounter: Payer: Self-pay | Admitting: Internal Medicine

## 2022-02-05 ENCOUNTER — Ambulatory Visit: Payer: Medicaid Other | Admitting: Internal Medicine

## 2022-02-05 VITALS — BP 110/70 | HR 80 | Temp 97.9°F | Ht 68.0 in | Wt 227.2 lb

## 2022-02-05 DIAGNOSIS — Z23 Encounter for immunization: Secondary | ICD-10-CM

## 2022-02-05 DIAGNOSIS — Z0000 Encounter for general adult medical examination without abnormal findings: Secondary | ICD-10-CM

## 2022-02-05 DIAGNOSIS — E669 Obesity, unspecified: Secondary | ICD-10-CM | POA: Diagnosis not present

## 2022-02-05 LAB — CBC WITH DIFFERENTIAL/PLATELET
Basophils Absolute: 0 10*3/uL (ref 0.0–0.1)
Basophils Relative: 0.6 % (ref 0.0–3.0)
Eosinophils Absolute: 0.1 10*3/uL (ref 0.0–0.7)
Eosinophils Relative: 1.6 % (ref 0.0–5.0)
HCT: 38.6 % (ref 36.0–46.0)
Hemoglobin: 12.7 g/dL (ref 12.0–15.0)
Lymphocytes Relative: 40.4 % (ref 12.0–46.0)
Lymphs Abs: 3.1 10*3/uL (ref 0.7–4.0)
MCHC: 32.8 g/dL (ref 30.0–36.0)
MCV: 83.1 fl (ref 78.0–100.0)
Monocytes Absolute: 0.6 10*3/uL (ref 0.1–1.0)
Monocytes Relative: 7.7 % (ref 3.0–12.0)
Neutro Abs: 3.8 10*3/uL (ref 1.4–7.7)
Neutrophils Relative %: 49.7 % (ref 43.0–77.0)
Platelets: 294 10*3/uL (ref 150.0–400.0)
RBC: 4.65 Mil/uL (ref 3.87–5.11)
RDW: 15.4 % (ref 11.5–15.5)
WBC: 7.6 10*3/uL (ref 4.0–10.5)

## 2022-02-05 LAB — COMPREHENSIVE METABOLIC PANEL
ALT: 39 U/L — ABNORMAL HIGH (ref 0–35)
AST: 27 U/L (ref 0–37)
Albumin: 4.3 g/dL (ref 3.5–5.2)
Alkaline Phosphatase: 56 U/L (ref 39–117)
BUN: 15 mg/dL (ref 6–23)
CO2: 30 mEq/L (ref 19–32)
Calcium: 9.2 mg/dL (ref 8.4–10.5)
Chloride: 104 mEq/L (ref 96–112)
Creatinine, Ser: 0.89 mg/dL (ref 0.40–1.20)
GFR: 86.49 mL/min (ref >60.00)
Glucose, Bld: 93 mg/dL (ref 70–99)
Potassium: 4.1 mEq/L (ref 3.5–5.1)
Sodium: 139 mEq/L (ref 135–145)
Total Bilirubin: 0.4 mg/dL (ref 0.2–1.2)
Total Protein: 6.8 g/dL (ref 6.0–8.3)

## 2022-02-05 LAB — LIPID PANEL
Cholesterol: 191 mg/dL (ref 0–200)
HDL: 66.8 mg/dL (ref >39.00)
LDL Cholesterol: 115 mg/dL — ABNORMAL HIGH (ref 0–99)
NonHDL: 124.5
Total CHOL/HDL Ratio: 3
Triglycerides: 49 mg/dL (ref 0.0–149.0)
VLDL: 9.8 mg/dL (ref 0.0–40.0)

## 2022-02-05 LAB — HEMOGLOBIN A1C: Hgb A1c MFr Bld: 5.3 % (ref 4.6–6.5)

## 2022-02-05 NOTE — Progress Notes (Signed)
Established Patient Office Visit     CC/Reason for Visit: Annual preventive exam  HPI: Tabitha Huff is a 32 y.o. female who is coming in today for the above mentioned reasons.  She has no past medical history of significance other than mild obesity.  She is overdue for eye exam, due for flu and COVID vaccines.  She is feeling well and has no acute concerns or complaints.  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   Past Medical/Surgical History: Past Medical History:  Diagnosis Date   Anemia    Iron Deficiency -- Diagnosed when she was a teenager.   Depression    Diagnosed Fall 2016.    Past Surgical History:  Procedure Laterality Date   NO PAST SURGERIES      Social History:  reports that she has never smoked. She has never used smokeless tobacco. She reports current alcohol use. She reports that she does not use drugs.  Allergies: No Known Allergies  Family History:  Family History  Problem Relation Age of Onset   Hepatitis C Mother        On treatment   Cancer Father        Skin   Cancer Maternal Uncle        Prostate   Cancer Maternal Grandfather        lung cancer   Cancer Paternal Grandmother        skin cancer   Diabetes Paternal Grandfather      Current Outpatient Medications:    Cyanocobalamin (B12 LIQUID HEALTH BOOSTER PO), , Disp: , Rfl:    Melatonin 10 MG TABS, Take 1 tablet by mouth at bedtime., Disp: , Rfl:   Review of Systems:  Constitutional: Denies fever, chills, diaphoresis, appetite change and fatigue.  HEENT: Denies photophobia, eye pain, redness, hearing loss, ear pain, congestion, sore throat, rhinorrhea, sneezing, mouth sores, trouble swallowing, neck pain, neck stiffness and tinnitus.   Respiratory: Denies SOB, DOE, cough, chest tightness,  and wheezing.   Cardiovascular: Denies chest pain, palpitations and leg swelling.  Gastrointestinal: Denies nausea, vomiting, abdominal pain, diarrhea, constipation,  blood in stool and abdominal distention.  Genitourinary: Denies dysuria, urgency, frequency, hematuria, flank pain and difficulty urinating.  Endocrine: Denies: hot or cold intolerance, sweats, changes in hair or nails, polyuria, polydipsia. Musculoskeletal: Denies myalgias, back pain, joint swelling, arthralgias and gait problem.  Skin: Denies pallor, rash and wound.  Neurological: Denies dizziness, seizures, syncope, weakness, light-headedness, numbness and headaches.  Hematological: Denies adenopathy. Easy bruising, personal or family bleeding history  Psychiatric/Behavioral: Denies suicidal ideation, mood changes, confusion, nervousness, sleep disturbance and agitation    Physical Exam: Vitals:   02/05/22 1403  BP: 110/70  Pulse: 80  Temp: 97.9 F (36.6 C)  TempSrc: Oral  SpO2: 98%  Weight: 227 lb 3.2 oz (103.1 kg)  Height: 5\' 8"  (1.727 m)    Body mass index is 34.55 kg/m.   Constitutional: NAD, calm, comfortable Eyes: PERRL, lids and conjunctivae normal ENMT: Mucous membranes are moist. Posterior pharynx clear of any exudate or lesions. Normal dentition. Tympanic membrane is pearly white, no erythema or bulging. Neck: normal, supple, no masses, no thyromegaly Respiratory: clear to auscultation bilaterally, no wheezing, no crackles. Normal respiratory effort. No accessory muscle use.  Cardiovascular: Regular rate and rhythm, no murmurs / rubs / gallops. No extremity edema. 2+ pedal pulses. No carotid bruits.  Abdomen: no tenderness, no masses palpated. No hepatosplenomegaly. Bowel sounds positive.  Musculoskeletal: no clubbing / cyanosis. No joint deformity upper and lower extremities. Good ROM, no contractures. Normal muscle tone.  Skin: no rashes, lesions, ulcers. No induration Neurologic: CN 2-12 grossly intact. Sensation intact, DTR normal. Strength 5/5 in all 4.  Psychiatric: Normal judgment and insight. Alert and oriented x 3. Normal mood.   [place] Office  Visit from 02/05/2022 in [facility] at [place]  PHQ-9 Total Score 0       Impression and Plan:  Encounter for preventive health examination  Need for influenza vaccination  Obesity (BMI 30-39.9) - Plan: CBC with Differential/Platelet, Comprehensive metabolic panel, Lipid panel, Hemoglobin A1c  -Recommend routine eye and dental care. -Immunizations: Flu in office today, advised to update COVID at pharmacy -Healthy lifestyle discussed in detail. -Labs to be updated today. -Colon cancer screening: Commence age 47 -Breast cancer screening: Commence age 67 -Cervical cancer screening: 01/2021 -Lung cancer screening: Not applicable -Prostate cancer screening: Not applicable -DEXA: Not applicable     Kyleena Scheirer Isaac Bliss, MD [facility]

## 2022-02-27 ENCOUNTER — Other Ambulatory Visit (HOSPITAL_COMMUNITY)
Admission: RE | Admit: 2022-02-27 | Discharge: 2022-02-27 | Disposition: A | Discharge status: Home / Self Care | Payer: Medicaid Other | Source: Ambulatory Visit | Attending: Certified Nurse Midwife | Admitting: Certified Nurse Midwife

## 2022-02-27 ENCOUNTER — Encounter: Payer: Self-pay | Admitting: Certified Nurse Midwife

## 2022-02-27 ENCOUNTER — Ambulatory Visit (INDEPENDENT_AMBULATORY_CARE_PROVIDER_SITE_OTHER): Payer: Medicaid Other | Admitting: Certified Nurse Midwife

## 2022-02-27 VITALS — BP 108/68 | HR 62 | Ht 68.0 in | Wt 230.0 lb

## 2022-02-27 DIAGNOSIS — Z3009 Encounter for other general counseling and advice on contraception: Secondary | ICD-10-CM | POA: Diagnosis not present

## 2022-02-27 DIAGNOSIS — Z01419 Encounter for gynecological examination (general) (routine) without abnormal findings: Secondary | ICD-10-CM | POA: Diagnosis present

## 2022-02-27 MED ORDER — ETONOGESTREL-ETHINYL ESTRADIOL 0.12-0.015 MG/24HR VA RING: VAGINAL_RING | VAGINAL | 12 refills | Status: DC

## 2022-02-27 NOTE — Progress Notes (Signed)
Pt presents for AEX. Requesting PAP and STD testing today. What birth control consult. Interested in [place] or depo.

## 2022-02-27 NOTE — Progress Notes (Signed)
ANNUAL EXAM Patient name: Tabitha Huff MRN [id number]  Date of birth: 1990-12-27 Chief Complaint:   None, here for annual exam and birth control  History of Present Illness:   Tabitha Huff is a 32 y.o. nulliparous female of [place] descent being seen today for a routine annual exam.  Current complaints: None  Patient's last menstrual period was 02/25/2022 (exact date).   Upstream - 02/27/22 1429       Pregnancy Intention Screening   Does the patient want to become pregnant in the next year? No    Does the patient's partner want to become pregnant in the next year? No    Would the patient like to discuss contraceptive options today? Yes      Contraception Wrap Up   Current Method Female Condom    End Method Female Condom;Vaginal Ring    How was the end contraceptive method provided? Prescription            The pregnancy intention screening data noted above was reviewed. Potential methods of contraception were discussed. The patient elected to proceed with Female Condom; Vaginal Ring.   Last pap 01/28/21. Results were: NILM w/ HRHPV negative. H/O abnormal pap: no Last mammogram: Never (age). Results were: N/A. Family h/o breast cancer: no Last colonoscopy: Never (age). Results were: N/A. Family h/o colorectal cancer: no     02/27/2022    8:48 AM 02/05/2022    2:56 PM 02/05/2022    2:20 PM 01/30/2021    3:24 PM 08/21/2019   11:05 AM  Depression screen PHQ 2/9  Decreased Interest 0 1 0 0 0  Down, Depressed, Hopeless 0 0 0 1 1  PHQ - 2 Score 0 1 0 1 1  Altered sleeping 0 2 0 3 0  Tired, decreased energy 0 1 0 1 0  Change in appetite 0 1 0 1 0  Feeling bad or failure about yourself  0 0 0 0 0  Trouble concentrating 0 0 0 0 0  Moving slowly or fidgety/restless 0 0 0 0 0  Suicidal thoughts 0 0 0 0 0  PHQ-9 Score 0 5 0 6 1  Difficult doing work/chores  Somewhat difficult Not difficult at all Somewhat difficult       02/27/2022    8:49 AM  GAD 7 : Generalized  Anxiety Score  Nervous, Anxious, on Edge 0  Control/stop worrying 0  Worry too much - different things 0  Trouble relaxing 0  Restless 0  Easily annoyed or irritable 0  Afraid - awful might happen 0  Total GAD 7 Score 0   Review of Systems:   Pertinent items are noted in HPI Denies any headaches, blurred vision, fatigue, shortness of breath, chest pain, abdominal pain, abnormal vaginal discharge/itching/odor/irritation, problems with periods, bowel movements, urination, or intercourse unless otherwise stated above. Pertinent History Reviewed:  Reviewed past medical,surgical, social and family history.  Reviewed problem list, medications and allergies.  Physical Assessment:   Vitals:   02/27/22 0843  BP: 108/68  Pulse: 62  Weight: 230 lb (104.3 kg)  Height: 5\' 8"  (1.727 m)   Body mass index is 34.97 kg/m.   Physical Examination:  General appearance - well appearing, and in no distress Mental status - alert, oriented to person, place, and time Psych:  She has a normal mood and affect Skin - warm and dry, normal color, no suspicious lesions noted Chest - effort normal, no problems with respiration noted Heart - normal rate  and regular rhythm Neck:  midline trachea, no thyromegaly or nodules Breasts - breasts appear normal, no suspicious masses, no skin or nipple changes or  axillary nodes, BSE demonstrated Abdomen - soft, nontender, nondistended, no masses or organomegaly Pelvic - exam deferred, Pap not indicated until 2026 Extremities:  No swelling or varicosities noted  Chaperone present for exam  No results found for this or any previous visit (from the past 24 hour(s)).  Assessment & Plan:  1. Encounter for annual routine gynecological examination - Mammogram: @ 32yo, or sooner if problems - Colonoscopy: @ 32yo, or sooner if problems - Cervicovaginal ancillary only( [place]) - HIV antibody (with reflex) - Hepatitis C Antibody - Hepatitis B Surface AntiGEN -  RPR  2. Counseling for initiation of birth control method - Reviewed desired options including depo, IUD, Nexplanon or Nuvaring. Does not like the side effect profile for Depo, the idea of Nexplanon and is not ready to try the IUD.  - etonogestrel-ethinyl estradiol (NUVARING) 0.12-0.015 MG/24HR vaginal ring; Insert vaginally and leave in place for 3 consecutive weeks, then remove for 1 week.  Dispense: 1 each; Refill: 12  Orders Placed This Encounter  Procedures   HIV antibody (with reflex)   Hepatitis C Antibody   Hepatitis B Surface AntiGEN   RPR   Meds:  Meds ordered this encounter  Medications   etonogestrel-ethinyl estradiol (NUVARING) 0.12-0.015 MG/24HR vaginal ring    Sig: Insert vaginally and leave in place for 3 consecutive weeks, then remove for 1 week.    Dispense:  1 each    Refill:  12    Order Specific Question:   Supervising Provider    Answer:   Donnamae Jude [[id number]]   Follow-up: Return in about 1 year (around 02/28/2023) for ANN.  Gabriel Carina, CNM 02/27/2022 2:40 PM

## 2022-02-28 LAB — HEPATITIS C ANTIBODY: Hep C Virus Ab: NONREACTIVE

## 2022-02-28 LAB — HIV ANTIBODY (ROUTINE TESTING W REFLEX): HIV Screen 4th Generation wRfx: NONREACTIVE

## 2022-02-28 LAB — HEPATITIS B SURFACE ANTIGEN: Hepatitis B Surface Ag: NEGATIVE

## 2022-02-28 LAB — RPR: RPR Ser Ql: NONREACTIVE

## 2022-03-02 LAB — CERVICOVAGINAL ANCILLARY ONLY
Chlamydia: NEGATIVE
Comment: NEGATIVE
Comment: NEGATIVE
Comment: NORMAL
Neisseria Gonorrhea: NEGATIVE
Trichomonas: NEGATIVE

## 2022-05-01 ENCOUNTER — Encounter: Payer: Self-pay | Admitting: Certified Nurse Midwife

## 2022-06-09 ENCOUNTER — Ambulatory Visit (INDEPENDENT_AMBULATORY_CARE_PROVIDER_SITE_OTHER): Payer: Medicaid Other | Admitting: Certified Nurse Midwife

## 2022-06-09 ENCOUNTER — Encounter: Payer: Self-pay | Admitting: Certified Nurse Midwife

## 2022-06-09 VITALS — BP 110/75 | HR 55 | Ht 68.0 in | Wt 218.2 lb

## 2022-06-09 DIAGNOSIS — Z30430 Encounter for insertion of intrauterine contraceptive device: Secondary | ICD-10-CM | POA: Diagnosis not present

## 2022-06-09 DIAGNOSIS — Z3009 Encounter for other general counseling and advice on contraception: Secondary | ICD-10-CM

## 2022-06-09 MED ORDER — LEVONORGESTREL 20.1 MCG/DAY IU IUD
1.0000 | INTRAUTERINE_SYSTEM | Freq: Once | INTRAUTERINE | Status: AC
  Administered 2022-06-09: 1 via INTRAUTERINE

## 2022-06-09 NOTE — Progress Notes (Signed)
    GYNECOLOGY OFFICE PROCEDURE NOTE  Tabitha Huff is a 32 y.o. G0P0 here for Liletta IUD insertion. No GYN concerns.  Last pap smear was on 01/28/21 and was normal.  IUD Insertion Procedure Note Patient identified, informed consent performed, consent signed.   Discussed risks of irregular bleeding, cramping, infection, malpositioning or misplacement of the IUD outside the uterus which may require further procedure such as laparoscopy. Also discussed >99% contraception efficacy, increased risk of ectopic pregnancy with failure of method.   Emphasized that this did not protect against STIs, condoms recommended during all sexual encounters. Time out was performed.  Urine pregnancy test negative.  Speculum placed in the vagina.  Cervix visualized.  Cleaned with Betadine x 2.  Grasped anteriorly with a single tooth tenaculum.  Uterus sounded to 4 cm.  Liletta IUD placed per manufacturer's recommendations.  Strings trimmed to 3 cm. Tenaculum was removed, good hemostasis noted.  Patient tolerated procedure well.   CNM confirmed placement via [place] in office today.   Patient was given post-procedure instructions.  She was advised to have backup contraception for one week.  Patient was also asked to check IUD strings periodically and follow up in 4-6 weeks for IUD check.   Tabitha Huff) Tabitha Portela, MSN, CNM  [facility]  06/09/22 12:42 PM

## 2022-06-09 NOTE — Progress Notes (Signed)
Pt presents for IUD insertion. Pt requesting Liletta.

## 2022-07-21 ENCOUNTER — Other Ambulatory Visit (HOSPITAL_COMMUNITY)
Admission: RE | Admit: 2022-07-21 | Discharge: 2022-07-21 | Disposition: A | Discharge status: Home / Self Care | Payer: Medicaid Other | Source: Ambulatory Visit | Attending: Obstetrics & Gynecology | Admitting: Obstetrics & Gynecology

## 2022-07-21 ENCOUNTER — Ambulatory Visit (INDEPENDENT_AMBULATORY_CARE_PROVIDER_SITE_OTHER): Payer: Medicaid Other | Admitting: Obstetrics & Gynecology

## 2022-07-21 VITALS — BP 106/71 | HR 68 | Wt 227.0 lb

## 2022-07-21 DIAGNOSIS — Z30431 Encounter for routine checking of intrauterine contraceptive device: Secondary | ICD-10-CM | POA: Diagnosis not present

## 2022-07-21 DIAGNOSIS — Z975 Presence of (intrauterine) contraceptive device: Secondary | ICD-10-CM | POA: Diagnosis present

## 2022-07-21 DIAGNOSIS — N921 Excessive and frequent menstruation with irregular cycle: Secondary | ICD-10-CM | POA: Diagnosis present

## 2022-07-21 NOTE — Progress Notes (Signed)
    GYNECOLOGY OFFICE ENCOUNTER NOTE  History:  32 y.o. G0P0 here today for today for IUD string check; Liletta  IUD was placed 06/08/21.  Reports some bleeding after intercourse and 2 prolonged cycles lasting 10-12 days after placement . No complaints about the IUD, no concerning side effects.  The following portions of the patient's history were reviewed and updated as appropriate: allergies, current medications, past family history, past medical history, past social history, past surgical history and problem list. Last pap smear on 01/28/21 was normal, negative HRHPV.  Review of Systems:  Pertinent items are noted in HPI.   Objective:  Physical Exam Blood pressure 106/71, pulse 68, weight 227 lb (103 kg), last menstrual period 07/03/2022. CONSTITUTIONAL: Well-developed, well-nourished female in no acute distress.  NEUROLOGIC: Alert and oriented to person, place, and time. Normal reflexes, muscle tone coordination.  PSYCHIATRIC: Normal mood and affect. Normal behavior. Normal judgment and thought content. CARDIOVASCULAR: Normal heart rate noted RESPIRATORY: Effort and breath sounds normal, no problems with respiration noted ABDOMEN: Soft, no distention noted.   PELVIC: Normal appearing external genitalia; normal appearing vaginal mucosa and cervix. Bloody discharge noted, testing sample obtained.  IUD strings visualized, about 1 cm in length outside cervix. Done in the presence of a chaperone.   Assessment & Plan:  Will follow up results of test and manage accordingly. Reassured about expected bleeding pattern after IUD placement. Patient to keep IUD in place for up to eight years; can come in for removal earlier if she desires or for any concerning side effects.   Jaynie Collins, MD, FACOG Obstetrician & Gynecologist, [facility] for [employer], [facility]

## 2022-07-22 LAB — CERVICOVAGINAL ANCILLARY ONLY
Bacterial Vaginitis (gardnerella): NEGATIVE
Candida Glabrata: NEGATIVE
Candida Vaginitis: NEGATIVE
Comment: NEGATIVE
Comment: NEGATIVE
Comment: NEGATIVE

## 2022-08-12 ENCOUNTER — Encounter: Payer: Self-pay | Admitting: Internal Medicine

## 2022-08-12 ENCOUNTER — Ambulatory Visit: Payer: Medicaid Other | Admitting: Internal Medicine

## 2022-08-12 VITALS — BP 110/80 | HR 70 | Temp 97.7°F | Wt 227.5 lb

## 2022-08-12 DIAGNOSIS — F988 Other specified behavioral and emotional disorders with onset usually occurring in childhood and adolescence: Secondary | ICD-10-CM

## 2022-08-12 NOTE — Progress Notes (Signed)
     Established Patient Office Visit     CC/Reason for Visit: Concerned about ADHD  HPI: Tabitha Huff is a 32 y.o. female who is coming in today for the above mentioned reasons.  She just finished nursing school.  During school she noted a fairly hard time focusing, staying on task and perseverating on things.  She thinks she might have ADHD and is wondering about how she would go around getting that diagnosis.   Past Medical/Surgical History: Past Medical History:  Diagnosis Date   Anemia    Iron Deficiency -- Diagnosed when she was a teenager.   Depression    Diagnosed Fall 2016.    Past Surgical History:  Procedure Laterality Date   NO PAST SURGERIES      Social History:  reports that she has never smoked. She has never used smokeless tobacco. She reports current alcohol use. She reports that she does not use drugs.  Allergies: No Known Allergies  Family History:  Family History  Problem Relation Age of Onset   Hepatitis C Mother        On treatment   Cancer Father        Skin   Cancer Maternal Uncle        Prostate   Cancer Maternal Grandfather        lung cancer   Cancer Paternal Grandmother        skin cancer   Diabetes Paternal Grandfather      Current Outpatient Medications:    ASHWAGANDHA 35 PO, Take by mouth., Disp: , Rfl:    Cyanocobalamin (B12 LIQUID HEALTH BOOSTER PO), , Disp: , Rfl:    Melatonin 10 MG TABS, Take 1 tablet by mouth at bedtime., Disp: , Rfl:   Review of Systems:  Negative unless indicated in HPI.   Physical Exam: Vitals:   08/12/22 1428  BP: 110/80  Pulse: 70  Temp: 97.7 F (36.5 C)  TempSrc: Oral  SpO2: 98%  Weight: 227 lb 8 oz (103.2 kg)    Body mass index is 34.59 kg/m.   Physical Exam Vitals reviewed.  Constitutional:      Appearance: Normal appearance.  Neurological:     General: No focal deficit present.     Mental Status: She is alert and oriented to person, place, and time.  Psychiatric:         Mood and Affect: Mood normal.        Behavior: Behavior normal.        Thought Content: Thought content normal.        Judgment: Judgment normal.      Impression and Plan:  Attention deficit disorder predominant inattentive type  -Information given about [facility].   Time spent:20 minutes reviewing chart, interviewing and examining patient and formulating plan of care.     Chaya Jan, MD [facility]
